# Patient Record
Sex: Male | Born: 1990 | Race: Black or African American | Hispanic: No | Marital: Single | Smoking: Current every day smoker
Health system: Southern US, Community
[De-identification: ages and names within clinical notes are randomized; demographics above are authoritative.]

## PROBLEM LIST (undated history)

## (undated) DIAGNOSIS — L0291 Cutaneous abscess, unspecified: Secondary | ICD-10-CM

---
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
## 2011-03-05 ENCOUNTER — Emergency Department (INDEPENDENT_AMBULATORY_CARE_PROVIDER_SITE_OTHER)
Admission: EM | Admit: 2011-03-05 | Discharge: 2011-03-05 | Disposition: A | Discharge status: Home / Self Care | Payer: Self-pay | Source: Home / Self Care | Attending: Emergency Medicine | Admitting: Emergency Medicine

## 2011-03-05 ENCOUNTER — Encounter: Payer: Self-pay | Admitting: *Deleted

## 2011-03-05 DIAGNOSIS — L0291 Cutaneous abscess, unspecified: Secondary | ICD-10-CM

## 2011-03-05 DIAGNOSIS — L0390 Cellulitis, unspecified: Secondary | ICD-10-CM

## 2011-03-05 MED ORDER — DOXYCYCLINE HYCLATE 100 MG PO CAPS: 100.0000 mg | ORAL_CAPSULE | Freq: Two times a day (BID) | ORAL | Status: AC

## 2011-03-05 NOTE — ED Provider Notes (Signed)
History     CSN: [id number] Arrival date & time: 03/05/2011  4:26 PM   First MD Initiated Contact with Patient 03/05/11 1617      Chief Complaint  Patient presents with  . Abscess    (Consider location/radiation/quality/duration/timing/severity/associated sxs/prior treatment) Patient is a 20 y.o. male presenting with abscess. The history is provided by the patient.  Abscess  This is a new problem. The problem occurs frequently. The problem has been unchanged. The problem is moderate. The abscess is characterized by redness, painfulness and swelling. The patient was exposed to OTC medications. Pertinent negatives include no anorexia and no fever.    History reviewed. No pertinent past medical history.  History reviewed. No pertinent past surgical history.  History reviewed. No pertinent family history.  History  Substance Use Topics  . Smoking status: Current Everyday Smoker  . Smokeless tobacco: Not on file  . Alcohol Use: No      Review of Systems  Constitutional: Negative for fever.  Gastrointestinal: Negative for anorexia.    Allergies  Review of patient's allergies indicates no known allergies.  Home Medications   Current Outpatient Rx  Name Route Sig Dispense Refill  . DOXYCYCLINE HYCLATE 100 MG PO CAPS Oral Take 1 capsule (100 mg total) by mouth 2 (two) times daily. X 10 days 20 capsule 0    BP 133/59  Pulse 65  Temp(Src) 98.3 F (36.8 C) (Oral)  Resp 16  SpO2 100%  Physical Exam  Nursing note and vitals reviewed. Musculoskeletal: He exhibits tenderness. He exhibits no edema.       Right knee: He exhibits erythema.       Legs: Skin: There is erythema.    ED Course  INCISION AND DRAINAGE Performed by: Vahan Wadsworth Authorized by: Jimmie Molly Consent: Verbal consent obtained. Patient understanding: patient states understanding of the procedure being performed Type: abscess Body area: lower extremity Local anesthetic: topical  anesthetic Scalpel size: 11 Drainage: purulent Packing material: 1/2 in gauze   (including critical care time)  Labs Reviewed - No data to display No results found.   1. Abscess and cellulitis       MDM  Localized abscess with cellulitis inner aspect Left thigh        Jimmie Molly, MD 03/05/11 1725

## 2011-03-05 NOTE — ED Notes (Signed)
Pt with ? Abscess right upper inner thigh onset x 3 days this am draining - painful to palpation

## 2013-09-14 ENCOUNTER — Encounter (HOSPITAL_COMMUNITY): Payer: Self-pay | Admitting: Emergency Medicine

## 2013-09-14 ENCOUNTER — Emergency Department (HOSPITAL_COMMUNITY)
Admission: EM | Admit: 2013-09-14 | Discharge: 2013-09-14 | Disposition: A | Discharge status: Home / Self Care | Payer: Medicaid Other | Attending: Emergency Medicine | Admitting: Emergency Medicine

## 2013-09-14 ENCOUNTER — Emergency Department (HOSPITAL_COMMUNITY): Payer: Medicaid Other

## 2013-09-14 DIAGNOSIS — S62309A Unspecified fracture of unspecified metacarpal bone, initial encounter for closed fracture: Secondary | ICD-10-CM | POA: Insufficient documentation

## 2013-09-14 DIAGNOSIS — F17200 Nicotine dependence, unspecified, uncomplicated: Secondary | ICD-10-CM | POA: Insufficient documentation

## 2013-09-14 DIAGNOSIS — S62300A Unspecified fracture of second metacarpal bone, right hand, initial encounter for closed fracture: Secondary | ICD-10-CM

## 2013-09-14 MED ORDER — ACETAMINOPHEN 325 MG PO TABS
650.0000 mg | ORAL_TABLET | Freq: Once | ORAL | Status: AC
  Administered 2013-09-14: 650 mg via ORAL
  Filled 2013-09-14: qty 2
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
## 2013-09-14 NOTE — Discharge Instructions (Signed)
Follow up with Dr. Melvyn Novasrtmann for further evaluation of your fracture. Call to schedule your appointment. Refer to attached documents for more information.

## 2013-09-14 NOTE — ED Provider Notes (Signed)
Medical screening examination/treatment/procedure(s) were performed by non-physician practitioner and as supervising physician I was immediately available for consultation/collaboration.   EKG Interpretation None      Iva Knapp, MD, FACEP   Iva L Knapp, MD 09/14/13 2210 

## 2014-06-22 ENCOUNTER — Emergency Department (INDEPENDENT_AMBULATORY_CARE_PROVIDER_SITE_OTHER)
Admission: EM | Admit: 2014-06-22 | Discharge: 2014-06-22 | Disposition: A | Discharge status: Home / Self Care | Payer: Self-pay | Source: Home / Self Care | Attending: Family Medicine | Admitting: Family Medicine

## 2014-06-22 ENCOUNTER — Encounter (HOSPITAL_COMMUNITY): Payer: Self-pay | Admitting: *Deleted

## 2014-06-22 DIAGNOSIS — J029 Acute pharyngitis, unspecified: Secondary | ICD-10-CM

## 2014-06-22 LAB — POCT RAPID STREP A: Streptococcus, Group A Screen (Direct): NEGATIVE

## 2014-06-22 MED ORDER — ACETAMINOPHEN 325 MG PO TABS
ORAL_TABLET | ORAL | Status: AC
  Filled 2014-06-22: qty 2

## 2014-06-22 MED ORDER — ACETAMINOPHEN 325 MG PO TABS
650.0000 mg | ORAL_TABLET | Freq: Once | ORAL | Status: AC
  Administered 2014-06-22: 650 mg via ORAL

## 2014-06-22 NOTE — ED Provider Notes (Signed)
CSN: [id number]     Arrival date & time 06/22/14  1541 History   First MD Initiated Contact with Patient 06/22/14 1706     Chief Complaint  Patient presents with  . Sore Throat   (Consider location/radiation/quality/duration/timing/severity/associated sxs/prior Treatment) HPI Comments: Smoker [place] [employer] Fever today  Patient is a 24 y.o. male presenting with pharyngitis. The history is provided by the patient.  Sore Throat This is a new problem. Episode onset: sx x 2-3 days. The problem occurs constantly. The problem has not changed since onset.Associated symptoms comments: Bilateral ear pain Congestion  Occasional headache.    History reviewed. No pertinent past medical history. History reviewed. No pertinent past surgical history. History reviewed. No pertinent family history. History  Substance Use Topics  . Smoking status: Current Every Day Smoker  . Smokeless tobacco: Not on file  . Alcohol Use: No    Review of Systems  All other systems reviewed and are negative.   Allergies  Review of patient's allergies indicates no known allergies.  Home Medications   Prior to Admission medications   Not on File   BP 124/67 mmHg  Pulse 74  Temp(Src) 99.9 F (37.7 C) (Oral)  Resp 16  SpO2 100% Physical Exam  Constitutional: He is oriented to person, place, and time. He appears well-developed and well-nourished. No distress.  HENT:  Head: Normocephalic and atraumatic.  Right Ear: Hearing, tympanic membrane, external ear and ear canal normal.  Left Ear: Hearing, tympanic membrane, external ear and ear canal normal.  Nose: Nose normal.  Mouth/Throat: Uvula is midline, oropharynx is clear and moist and mucous membranes are normal.  Eyes: Conjunctivae are normal.  Neck: Normal range of motion.  Cardiovascular: Normal rate, regular rhythm and normal heart sounds.   Pulmonary/Chest: Effort normal and breath sounds normal.  Abdominal: Soft. Bowel sounds are  normal.  Musculoskeletal: Normal range of motion.  Lymphadenopathy:    He has no cervical adenopathy.  Neurological: He is alert and oriented to person, place, and time.  Skin: Skin is warm and dry. No rash noted. No erythema.  Psychiatric: He has a normal mood and affect. His behavior is normal.  Nursing note and vitals reviewed.   ED Course  Procedures (including critical care time) Labs Review Labs Reviewed  POCT RAPID STREP A ([facility]RG CARE ONLY)    Imaging Review No results found.   MDM   1. Pharyngitis   Your test for strep throat was negative. Please use either tylenol or ibuprofen at home as directed on packaging for pain and fever. Warm salt water gargles 3-4 x day. Your throat swab will be held for three day culture and if results indicate the need for treatment, you will be notified by phone. Expect improvement over the next few days. Follow up if symptoms become worse.     Ria ClockJennifer Lee H Nayson Traweek, [place] 06/22/14 [phone]

## 2014-06-22 NOTE — Discharge Instructions (Signed)
Your test for strep throat was negative. Please use either tylenol or ibuprofen at home as directed on packaging for pain and fever. Warm salt water gargles 3-4 x day. Your throat swab will be held for three day culture and if results indicate the need for treatment, you will be notified by phone. Expect improvement over the next few days. Follow up if symptoms become worse.   Pharyngitis Pharyngitis is redness, pain, and swelling (inflammation) of your pharynx.  CAUSES  Pharyngitis is usually caused by infection. Most of the time, these infections are from viruses (viral) and are part of a cold. However, sometimes pharyngitis is caused by bacteria (bacterial). Pharyngitis can also be caused by allergies. Viral pharyngitis may be spread from person to person by coughing, sneezing, and personal items or utensils (cups, forks, spoons, toothbrushes). Bacterial pharyngitis may be spread from person to person by more intimate contact, such as kissing.  SIGNS AND SYMPTOMS  Symptoms of pharyngitis include:   Sore throat.   Tiredness (fatigue).   Low-grade fever.   Headache.  Joint pain and muscle aches.  Skin rashes.  Swollen lymph nodes.  Plaque-like film on throat or tonsils (often seen with bacterial pharyngitis). DIAGNOSIS  Your health care provider will ask you questions about your illness and your symptoms. Your medical history, along with a physical exam, is often all that is needed to diagnose pharyngitis. Sometimes, a rapid strep test is done. Other lab tests may also be done, depending on the suspected cause.  TREATMENT  Viral pharyngitis will usually get better in 3-4 days without the use of medicine. Bacterial pharyngitis is treated with medicines that kill germs (antibiotics).  HOME CARE INSTRUCTIONS   Drink enough water and fluids to keep your urine clear or pale yellow.   Only take over-the-counter or prescription medicines as directed by your health care provider:    If you are prescribed antibiotics, make sure you finish them even if you start to feel better.   Do not take aspirin.   Get lots of rest.   Gargle with 8 oz of salt water ( tsp of salt per 1 qt of water) as often as every 1-2 hours to soothe your throat.   Throat lozenges (if you are not at risk for choking) or sprays may be used to soothe your throat. SEEK MEDICAL CARE IF:   You have large, tender lumps in your neck.  You have a rash.  You cough up green, yellow-brown, or bloody spit. SEEK IMMEDIATE MEDICAL CARE IF:   Your neck becomes stiff.  You drool or are unable to swallow liquids.  You vomit or are unable to keep medicines or liquids down.  You have severe pain that does not go away with the use of recommended medicines.  You have trouble breathing (not caused by a stuffy nose). MAKE SURE YOU:   Understand these instructions.  Will watch your condition.  Will get help right away if you are not doing well or get worse. Document Released: 03/17/2005 Document Revised: 01/05/2013 Document Reviewed: 11/22/2012 [facility] Patient Information 2015 [place], [place]. This information is not intended to replace advice given to you by your health care provider. Make sure you discuss any questions you have with your health care provider.  Salt Water Gargle This solution will help make your mouth and throat feel better. HOME CARE INSTRUCTIONS   Mix 1 teaspoon of salt in 8 ounces of warm water.  Gargle with this solution as much or often as  you need or as directed. Swish and gargle gently if you have any sores or wounds in your mouth.  Do not swallow this mixture. Document Released: 12/20/2003 Document Revised: 06/09/2011 Document Reviewed: 05/12/2008 [facility] Patient Information 2015 [place], [place]. This information is not intended to replace advice given to you by your health care provider. Make sure you discuss any questions you have with your health care  provider.  Sore Throat A sore throat is pain, burning, irritation, or scratchiness of the throat. There is often pain or tenderness when swallowing or talking. A sore throat may be accompanied by other symptoms, such as coughing, sneezing, fever, and swollen neck glands. A sore throat is often the first sign of another sickness, such as a cold, flu, strep throat, or mononucleosis (commonly known as mono). Most sore throats go away without medical treatment. CAUSES  The most common causes of a sore throat include:  A viral infection, such as a cold, flu, or mono.  A bacterial infection, such as strep throat, tonsillitis, or whooping cough.  Seasonal allergies.  Dryness in the air.  Irritants, such as smoke or pollution.  Gastroesophageal reflux disease (GERD). HOME CARE INSTRUCTIONS   Only take over-the-counter medicines as directed by your caregiver.  Drink enough fluids to keep your urine clear or pale yellow.  Rest as needed.  Try using throat sprays, lozenges, or sucking on hard candy to ease any pain (if older than 4 years or as directed).  Sip warm liquids, such as broth, herbal tea, or warm water with honey to relieve pain temporarily. You may also eat or drink cold or frozen liquids such as frozen ice pops.  Gargle with salt water (mix 1 tsp salt with 8 oz of water).  Do not smoke and avoid secondhand smoke.  Put a cool-mist humidifier in your bedroom at night to moisten the air. You can also turn on a hot shower and sit in the bathroom with the door closed for 5-10 minutes. SEEK IMMEDIATE MEDICAL CARE IF:  You have difficulty breathing.  You are unable to swallow fluids, soft foods, or your saliva.  You have increased swelling in the throat.  Your sore throat does not get better in 7 days.  You have nausea and vomiting.  You have a fever or persistent symptoms for more than 2-3 days.  You have a fever and your symptoms suddenly get worse. MAKE SURE YOU:    Understand these instructions.  Will watch your condition.  Will get help right away if you are not doing well or get worse. Document Released: 04/24/2004 Document Revised: 03/03/2012 Document Reviewed: 11/23/2011 [facility] Patient Information 2015 [place], [place]. This information is not intended to replace advice given to you by your health care provider. Make sure you discuss any questions you have with your health care provider.  Strep Throat Tests While most sore throats are caused by viruses, at times they are caused by a bacteria called group A Streptococci (strep throat). It is important to determine the cause because the strep bacteria is treated with antibiotic medication. There are 2 types of tests for strep throat: a rapid strep test and a throat culture. Both tests are done by wiping a swab over the back of the throat and then using chemicals to identify the type of bacteria present. The rapid strep test takes 10 to 20 minutes. If the rapid strep test is negative, a throat culture may be performed to confirm the results. With a throat culture, the  swab is used to spread the bacteria on a gel plate and grow it in a lab, which may take 1 to 2 days. In some cases, the culture will detect strep bacteria not found with the rapid strep test. If the result of the rapid strep test is positive, no further testing is needed, and your caregiver will prescribe antibiotics. Not all test results are available during your visit. If your test results are not back during the visit, make an appointment with your caregiver to find out the results. Do not assume everything is normal if you have not heard from your caregiver or the medical facility. It is important for you to follow up on all of your test results. SEEK MEDICAL CARE IF:   Your symptoms are not improving within 1 to 2 days, or you are getting worse.  You have any other questions or concerns. SEEK IMMEDIATE MEDICAL CARE IF:   You have  increased difficulty with swallowing.  You develop trouble breathing.  You have a fever. Document Released: 04/24/2004 Document Revised: 06/09/2011 Document Reviewed: 06/22/2013 [facility] Patient Information 2015 [place], [place]. This information is not intended to replace advice given to you by your health care provider. Make sure you discuss any questions you have with your health care provider.

## 2014-06-22 NOTE — ED Notes (Signed)
Pt        Reports  Symptoms     Of  sorethroat  Fever       And  Bilateral  Earache  With  The  Onset      For  About   6  Days     Pt reppots  Symptoms  Not  releived  By OTC meds   And  Or  Home  Remedies        Pt is  Sitting  Upright  On the  Exam table  Speaking in complete  sentances  And  Appearing  In  No  Acute  Distress

## 2014-06-26 LAB — CULTURE, GROUP A STREP

## 2014-06-26 NOTE — ED Notes (Signed)
Throat culture: Strep beta hemolytic not group A.  Lab shown to Dr. Piedad ClimesHonig. She reviewed chart and said no further treatment needed. Vassie MoselleYork, Artelia Game M 06/26/2014

## 2014-08-09 ENCOUNTER — Emergency Department (HOSPITAL_COMMUNITY)
Admission: EM | Admit: 2014-08-09 | Discharge: 2014-08-09 | Disposition: A | Discharge status: Home / Self Care | Payer: Self-pay | Attending: Emergency Medicine | Admitting: Emergency Medicine

## 2014-08-09 ENCOUNTER — Emergency Department (HOSPITAL_COMMUNITY): Payer: Self-pay

## 2014-08-09 ENCOUNTER — Encounter (HOSPITAL_COMMUNITY): Payer: Self-pay | Admitting: Family Medicine

## 2014-08-09 DIAGNOSIS — S80812A Abrasion, left lower leg, initial encounter: Secondary | ICD-10-CM | POA: Insufficient documentation

## 2014-08-09 DIAGNOSIS — S79912A Unspecified injury of left hip, initial encounter: Secondary | ICD-10-CM | POA: Insufficient documentation

## 2014-08-09 DIAGNOSIS — Y998 Other external cause status: Secondary | ICD-10-CM | POA: Insufficient documentation

## 2014-08-09 DIAGNOSIS — Z720 Tobacco use: Secondary | ICD-10-CM | POA: Insufficient documentation

## 2014-08-09 DIAGNOSIS — M25562 Pain in left knee: Secondary | ICD-10-CM

## 2014-08-09 DIAGNOSIS — Y92410 Unspecified street and highway as the place of occurrence of the external cause: Secondary | ICD-10-CM | POA: Insufficient documentation

## 2014-08-09 DIAGNOSIS — Y9355 Activity, bike riding: Secondary | ICD-10-CM | POA: Insufficient documentation

## 2014-08-09 DIAGNOSIS — M25559 Pain in unspecified hip: Secondary | ICD-10-CM

## 2014-08-09 MED ORDER — NAPROXEN 250 MG PO TABS: 250.0000 mg | ORAL_TABLET | Freq: Two times a day (BID) | ORAL | Status: DC
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
## 2014-08-09 NOTE — ED Notes (Signed)
Pt returned from xray

## 2014-08-09 NOTE — ED Notes (Signed)
Patient transported to X-ray 
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
## 2015-01-06 ENCOUNTER — Emergency Department (HOSPITAL_COMMUNITY): Payer: Self-pay

## 2015-01-06 ENCOUNTER — Emergency Department (HOSPITAL_COMMUNITY)
Admission: EM | Admit: 2015-01-06 | Discharge: 2015-01-06 | Disposition: A | Discharge status: Home / Self Care | Payer: Self-pay | Attending: Emergency Medicine | Admitting: Emergency Medicine

## 2015-01-06 ENCOUNTER — Encounter (HOSPITAL_COMMUNITY): Payer: Self-pay | Admitting: Nurse Practitioner

## 2015-01-06 DIAGNOSIS — R1011 Right upper quadrant pain: Secondary | ICD-10-CM | POA: Insufficient documentation

## 2015-01-06 DIAGNOSIS — Z720 Tobacco use: Secondary | ICD-10-CM | POA: Insufficient documentation

## 2015-01-06 DIAGNOSIS — R1013 Epigastric pain: Secondary | ICD-10-CM | POA: Insufficient documentation

## 2015-01-06 DIAGNOSIS — R197 Diarrhea, unspecified: Secondary | ICD-10-CM | POA: Insufficient documentation

## 2015-01-06 LAB — URINALYSIS, ROUTINE W REFLEX MICROSCOPIC
GLUCOSE, UA: NEGATIVE mg/dL
KETONES UR: NEGATIVE mg/dL
Leukocytes, UA: NEGATIVE
Nitrite: NEGATIVE
PH: 5.5 (ref 5.0–8.0)
Protein, ur: NEGATIVE mg/dL
Specific Gravity, Urine: 1.031 — ABNORMAL HIGH (ref 1.005–1.030)
Urobilinogen, UA: 0.2 mg/dL (ref 0.0–1.0)

## 2015-01-06 LAB — COMPREHENSIVE METABOLIC PANEL
ALK PHOS: 86 U/L (ref 38–126)
ALT: 24 U/L (ref 17–63)
AST: 31 U/L (ref 15–41)
Albumin: 4.5 g/dL (ref 3.5–5.0)
Anion gap: 6 (ref 5–15)
BUN: 11 mg/dL (ref 6–20)
CALCIUM: 9.4 mg/dL (ref 8.9–10.3)
CHLORIDE: 106 mmol/L (ref 101–111)
CO2: 24 mmol/L (ref 22–32)
CREATININE: 1.01 mg/dL (ref 0.61–1.24)
GFR calc Af Amer: >60 mL/min (ref >60)
Glucose, Bld: 87 mg/dL (ref 65–99)
Potassium: 4 mmol/L (ref 3.5–5.1)
Sodium: 136 mmol/L (ref 135–145)
Total Bilirubin: 0.5 mg/dL (ref 0.3–1.2)
Total Protein: 7.8 g/dL (ref 6.5–8.1)

## 2015-01-06 LAB — URINE MICROSCOPIC-ADD ON

## 2015-01-06 LAB — CBC
HCT: 46.9 % (ref 39.0–52.0)
Hemoglobin: 16.1 g/dL (ref 13.0–17.0)
MCH: 31 pg (ref 26.0–34.0)
MCHC: 34.3 g/dL (ref 30.0–36.0)
MCV: 90.2 fL (ref 78.0–100.0)
PLATELETS: 408 10*3/uL — AB (ref 150–400)
RBC: 5.2 MIL/uL (ref 4.22–5.81)
RDW: 12.5 % (ref 11.5–15.5)
WBC: 11.1 10*3/uL — AB (ref 4.0–10.5)

## 2015-01-06 LAB — LIPASE, BLOOD: LIPASE: 28 U/L (ref 22–51)

## 2015-01-06 MED ORDER — METRONIDAZOLE 500 MG PO TABS: 500.0000 mg | ORAL_TABLET | Freq: Two times a day (BID) | ORAL | Status: DC

## 2015-01-06 MED ORDER — OMEPRAZOLE 20 MG PO CPDR: 20.0000 mg | DELAYED_RELEASE_CAPSULE | Freq: Every day | ORAL | Status: DC

## 2015-01-06 MED ORDER — SODIUM CHLORIDE 0.9 % IV BOLUS (SEPSIS)
1000.0000 mL | Freq: Once | INTRAVENOUS | Status: AC
  Administered 2015-01-06: 1000 mL via INTRAVENOUS

## 2015-01-06 MED ORDER — CIPROFLOXACIN HCL 500 MG PO TABS: 500.0000 mg | ORAL_TABLET | Freq: Two times a day (BID) | ORAL | Status: DC

## 2015-01-06 MED ORDER — GI COCKTAIL ~~LOC~~
30.0000 mL | Freq: Once | ORAL | Status: AC
  Administered 2015-01-06: 30 mL via ORAL
  Filled 2015-01-06: qty 30

## 2015-01-06 MED ORDER — ONDANSETRON HCL 4 MG PO TABS: 4.0000 mg | ORAL_TABLET | Freq: Three times a day (TID) | ORAL | Status: DC | PRN

## 2015-01-06 NOTE — ED Notes (Signed)
He c/o 5-6 day history of generalized abd pain, belching with foul smell, having to have bowel movement every time he eats or drinks anything.he denies n/v/fevers/urinary changes. He is A&Ox4, resp e/u

## 2015-01-06 NOTE — ED Notes (Signed)
Pt is in stable condition upon d/c and ambulates from ED. 

## 2015-01-06 NOTE — ED Provider Notes (Signed)
CSN: [id number]     Arrival date & time 01/06/15  1222 History   First MD Initiated Contact with Patient 01/06/15 1317     Chief Complaint  Patient presents with  . Abdominal Pain     (Consider location/radiation/quality/duration/timing/severity/associated sxs/prior Treatment) Patient is a 24 y.o. male presenting with abdominal pain. The history is provided by the patient.  Abdominal Pain Pain location:  Epigastric and RUQ Pain quality: sharp and squeezing   Pain radiates to:  Does not radiate Pain severity:  Severe Duration:  5 days Timing:  Intermittent Progression:  Waxing and waning Chronicity:  New Context: alcohol use   Context: not recent illness, not sick contacts and not suspicious food intake   Relieved by:  Nothing Ineffective treatments:  Acetaminophen and belching Associated symptoms: belching and diarrhea   Associated symptoms: no anorexia, no chest pain, no chills, no constipation, no cough, no dysuria, no fatigue, no fever, no flatus, no hematemesis, no hematochezia, no hematuria, no nausea, no shortness of breath, no sore throat and no vomiting   Risk factors: no aspirin use, not obese and no recent hospitalization   Risk factors comment:  Goody powder last week  Pt is a 24 y.o. Male, otherwise healthy, presents to the emergency room complaining of epigastric and RUQ abdominal pain with dyspepsia, bloating, foul belching, and 6 days of green diarrhea, without blood.  Abdominal pain is rated 3/10, without radiation, intermittent without exacerbating or alleviating factors.  He denies daily ETOH or NSAID use.  He denies any recent antibiotic use.  He has not other complaints.  History reviewed. No pertinent past medical history. History reviewed. No pertinent past surgical history. History reviewed. No pertinent family history. Social History  Substance Use Topics  . Smoking status: Current Every Day Smoker    Types: Cigarettes  . Smokeless tobacco: None  .  Alcohol Use: No    Review of Systems  Constitutional: Negative for fever, chills and fatigue.  HENT: Negative for sore throat.   Respiratory: Negative for cough and shortness of breath.   Cardiovascular: Negative for chest pain.  Gastrointestinal: Positive for abdominal pain and diarrhea. Negative for nausea, vomiting, constipation, hematochezia, anorexia, flatus and hematemesis.  Genitourinary: Negative for dysuria and hematuria.      Allergies  Review of patient's allergies indicates no known allergies.  Home Medications   Prior to Admission medications   Medication Sig Start Date End Date Taking? Authorizing Provider  acetaminophen (TYLENOL) 500 MG tablet Take 500 mg by mouth every 6 (six) hours as needed for mild pain.   Yes Historical Provider, MD  ciprofloxacin (CIPRO) 500 MG tablet Take 1 tablet (500 mg total) by mouth 2 (two) times daily. 01/06/15   Danelle Berry, PA-C  metroNIDAZOLE (FLAGYL) 500 MG tablet Take 1 tablet (500 mg total) by mouth 2 (two) times daily. 01/06/15   Danelle Berry, PA-C  omeprazole (PRILOSEC) 20 MG capsule Take 1 capsule (20 mg total) by mouth daily. 01/06/15   Danelle Berry, PA-C  ondansetron (ZOFRAN) 4 MG tablet Take 1 tablet (4 mg total) by mouth every 8 (eight) hours as needed for nausea or vomiting. 01/06/15   Danelle Berry, PA-C   BP 114/76 mmHg  Pulse 60  Temp(Src) 98.7 F (37.1 C) (Oral)  Resp 16  Ht 6\' 1"  (1.854 m)  Wt 202 lb 11.2 oz (91.944 kg)  BMI 26.75 kg/m2  SpO2 99% Physical Exam  Constitutional: He is oriented to person, place, and time. He appears well-developed and  well-nourished. No distress.  HENT:  Head: Normocephalic and atraumatic.  Nose: Nose normal.  Mouth/Throat: Oropharynx is clear and moist. No oropharyngeal exudate.  Eyes: Conjunctivae and EOM are normal. Pupils are equal, round, and reactive to light. Right eye exhibits no discharge. Left eye exhibits no discharge. No scleral icterus.  Neck: Normal range of motion. No JVD  present. No tracheal deviation present. No thyromegaly present.  Cardiovascular: Normal rate, regular rhythm, normal heart sounds and intact distal pulses.  Exam reveals no gallop and no friction rub.   No murmur heard. Pulmonary/Chest: Effort normal and breath sounds normal. No respiratory distress. He has no wheezes. He has no rales. He exhibits no tenderness.  Abdominal: Soft. Bowel sounds are normal. He exhibits no distension and no mass. There is tenderness. There is no rebound and no guarding.  Mild epigastric and RUQ tenderness  Musculoskeletal: Normal range of motion. He exhibits no edema or tenderness.  Lymphadenopathy:    He has no cervical adenopathy.  Neurological: He is alert and oriented to person, place, and time. He has normal reflexes. No cranial nerve deficit. He exhibits normal muscle tone. Coordination normal.  Skin: Skin is warm and dry. No rash noted. He is not diaphoretic. No erythema. No pallor.  Psychiatric: He has a normal mood and affect. His behavior is normal. Judgment and thought content normal.  Nursing note and vitals reviewed.   ED Course  Procedures (including critical care time) Labs Review Labs Reviewed  CBC - Abnormal; Notable for the following:    WBC 11.1 (*)    Platelets 408 (*)    All other components within normal limits  URINALYSIS, ROUTINE W REFLEX MICROSCOPIC (NOT AT [facility]) - Abnormal; Notable for the following:    Specific Gravity, Urine 1.031 (*)    Hgb urine dipstick SMALL (*)    Bilirubin Urine SMALL (*)    All other components within normal limits  URINE MICROSCOPIC-ADD ON - Abnormal; Notable for the following:    Crystals CA OXALATE CRYSTALS (*)    All other components within normal limits  COMPREHENSIVE METABOLIC PANEL  LIPASE, BLOOD    Imaging Review US Abdomen Limited Ruq  01/06/2015   CLINICAL DATA:  Right upper quadrant and epigastric pain for 5 days.  EXAM: US ABDOMEN LIMITED - RIGHT UPPER QUADRANT  COMPARISON:  CT abdomen  pelvis 05/22/2010  FINDINGS: Gallbladder:  No gallstones or wall thickening visualized. No sonographic Murphy sign noted.  Common bile duct:  Diameter: 2 mm  Liver:  No focal lesion identified. Within normal limits in parenchymal echogenicity.  IMPRESSION: No cholelithiasis or sonographic evidence for acute cholecystitis.   Electronically Signed   By: Annia Belt M.D.   On: 01/06/2015 15:46   I have personally reviewed and evaluated these images and lab results as part of my medical decision-making.   EKG Interpretation None      MDM   Final diagnoses:  Right upper quadrant pain  Diarrhea, unspecified type    Pt with diarrhea and abdominal pain, both which occur after eating. He has associated dyspepsia and otherwise denies any other symptoms. His vitals are within normal limits, exam reveals mild epigastric and right upper quadrant tenderness. We will obtain basic labs, right upper quadrant ultrasound.  Patient reported significant improvement with GI cocktail.  Patient's lab work with significant for mild leukocytosis of 11.1. Lipase is negative.  Right upper quadrant ultrasound negative for cholelithiasis or acute cholecystitis. The patient has had no vomiting, no diarrhea or  blood in the ER.  Will treat for infectious gastroenteritis, with Cipro, Flagyl, Zofran for nausea, and PPI for GERD/dyspepsia  The patient was updated with findings, given strict return precautions, he verbalized understanding. He was given follow-up with [facility] and [facility] as needed.  Filed Vitals:   01/06/15 1430 01/06/15 1445 01/06/15 1500 01/06/15 1515  BP: 111/76 110/80 119/70 114/76  Pulse: 69 68 74 60  Temp:      TempSrc:      Resp:      Height:      Weight:      SpO2: 97% 98% 100% 99%   Medications  gi cocktail (Maalox,Lidocaine,Donnatal) (30 mLs Oral Given 01/06/15 1356)  sodium chloride 0.9 % bolus 1,000 mL (1,000 mLs Intravenous New Bag/Given 01/06/15 1624)     Danelle Berry, PA-C 01/08/15 1039  Nelva Nay, MD 01/12/15 1220

## 2015-01-06 NOTE — ED Notes (Signed)
Pt returned from US

## 2015-01-06 NOTE — Discharge Instructions (Signed)
Abdominal Pain, Adult °Many things can cause abdominal pain. Usually, abdominal pain is not caused by a disease and will improve without treatment. It can often be observed and treated at home. Your health care provider will do a physical exam and possibly order blood tests and X-rays to help determine the seriousness of your pain. However, in many cases, more time must pass before a clear cause of the pain can be found. Before that point, your health care provider may not know if you need more testing or further treatment. °HOME CARE INSTRUCTIONS °Monitor your abdominal pain for any changes. The following actions may help to alleviate any discomfort you are experiencing: °· Only take over-the-counter or prescription medicines as directed by your health care provider. °· Do not take laxatives unless directed to do so by your health care provider. °· Try a clear liquid diet (broth, tea, or water) as directed by your health care provider. Slowly move to a bland diet as tolerated. °SEEK MEDICAL CARE IF: °· You have unexplained abdominal pain. °· You have abdominal pain associated with nausea or diarrhea. °· You have pain when you urinate or have a bowel movement. °· You experience abdominal pain that wakes you in the night. °· You have abdominal pain that is worsened or improved by eating food. °· You have abdominal pain that is worsened with eating fatty foods. °· You have a fever. °SEEK IMMEDIATE MEDICAL CARE IF: °· Your pain does not go away within 2 hours. °· You keep throwing up (vomiting). °· Your pain is felt only in portions of the abdomen, such as the right side or the left lower portion of the abdomen. °· You pass bloody or black tarry stools. °MAKE SURE YOU: °· Understand these instructions. °· Will watch your condition. °· Will get help right away if you are not doing well or get worse. °  °This information is not intended to replace advice given to you by your health care provider. Make sure you discuss  any questions you have with your health care provider. °  °Document Released: 12/25/2004 Document Revised: 12/06/2014 Document Reviewed: 11/24/2012 °Elsevier Interactive Patient Education ©2016 Elsevier Inc[employer] ° °Diarrhea °Diarrhea is frequent loose and watery bowel movements. It can cause you to feel weak and dehydrated. Dehydration can cause you to become tired and thirsty, have a dry mouth, and have decreased urination that often is dark yellow. Diarrhea is a sign of another problem, most often an infection that will not last long. In most cases, diarrhea typically lasts 2-3 days. However, it can last longer if it is a sign of something more serious. It is important to treat your diarrhea as directed by your caregiver to lessen or prevent future episodes of diarrhea. °CAUSES  °Some common causes include: °· Gastrointestinal infections caused by viruses, bacteria, or parasites. °· Food poisoning or food allergies. °· Certain medicines, such as antibiotics, chemotherapy, and laxatives. °· Artificial sweeteners and fructose. °· Digestive disorders. °HOME CARE INSTRUCTIONS °· Ensure adequate fluid intake (hydration): Have 1 cup (8 oz) of fluid for each diarrhea episode. Avoid fluids that contain simple sugars or sports drinks, fruit juices, whole milk products, and sodas. Your urine should be clear or pale yellow if you are drinking enough fluids. Hydrate with an oral rehydration solution that you can purchase at pharmacies, retail stores, and online. You can prepare an oral rehydration solution at home by mixing the following ingredients together: °¨  - tsp table salt. °¨ ¾ tsp baking soda. °¨    tsp salt substitute containing potassium chloride. °¨ 1  tablespoons sugar. °¨ 1 L (34 oz) of water. °· Certain foods and beverages may increase the speed at which food moves through the gastrointestinal (GI) tract. These foods and beverages should be avoided and include: °¨ Caffeinated and alcoholic beverages. °¨ High-fiber  foods, such as raw fruits and vegetables, nuts, seeds, and whole grain breads and cereals. °¨ Foods and beverages sweetened with sugar alcohols, such as xylitol, sorbitol, and mannitol. °· Some foods may be well tolerated and may help thicken stool including: °¨ Starchy foods, such as rice, toast, pasta, low-sugar cereal, oatmeal, grits, baked potatoes, crackers, and bagels. °¨ Bananas. °¨ Applesauce. °· Add probiotic-rich foods to help increase healthy bacteria in the GI tract, such as yogurt and fermented milk products. °· Wash your hands well after each diarrhea episode. °· Only take over-the-counter or prescription medicines as directed by your caregiver. °· Take a warm bath to relieve any burning or pain from frequent diarrhea episodes. °SEEK IMMEDIATE MEDICAL CARE IF:  °· You are unable to keep fluids down. °· You have persistent vomiting. °· You have blood in your stool, or your stools are black and tarry. °· You do not urinate in 6-8 hours, or there is only a small amount of very dark urine. °· You have abdominal pain that increases or localizes. °· You have weakness, dizziness, confusion, or light-headedness. °· You have a severe headache. °· Your diarrhea gets worse or does not get better. °· You have a fever or persistent symptoms for more than 2-3 days. °· You have a fever and your symptoms suddenly get worse. °MAKE SURE YOU:  °· Understand these instructions. °· Will watch your condition. °· Will get help right away if you are not doing well or get worse. °  °This information is not intended to replace advice given to you by your health care provider. Make sure you discuss any questions you have with your health care provider. °  °Document Released: 03/07/2002 Document Revised: 04/07/2014 Document Reviewed: 11/23/2011 °Elsevier Interactive Patient Education ©2016 Elsevier Inc. ° °

## 2015-04-01 ENCOUNTER — Emergency Department (HOSPITAL_COMMUNITY)
Admission: EM | Admit: 2015-04-01 | Discharge: 2015-04-01 | Disposition: A | Discharge status: Home / Self Care | Payer: Self-pay | Attending: Emergency Medicine | Admitting: Emergency Medicine

## 2015-04-01 ENCOUNTER — Encounter (HOSPITAL_COMMUNITY): Payer: Self-pay | Admitting: *Deleted

## 2015-04-01 DIAGNOSIS — Y9289 Other specified places as the place of occurrence of the external cause: Secondary | ICD-10-CM | POA: Insufficient documentation

## 2015-04-01 DIAGNOSIS — Z792 Long term (current) use of antibiotics: Secondary | ICD-10-CM | POA: Insufficient documentation

## 2015-04-01 DIAGNOSIS — Z79899 Other long term (current) drug therapy: Secondary | ICD-10-CM | POA: Insufficient documentation

## 2015-04-01 DIAGNOSIS — Y998 Other external cause status: Secondary | ICD-10-CM | POA: Insufficient documentation

## 2015-04-01 DIAGNOSIS — F17210 Nicotine dependence, cigarettes, uncomplicated: Secondary | ICD-10-CM | POA: Insufficient documentation

## 2015-04-01 DIAGNOSIS — Y9389 Activity, other specified: Secondary | ICD-10-CM | POA: Insufficient documentation

## 2015-04-01 DIAGNOSIS — Z23 Encounter for immunization: Secondary | ICD-10-CM | POA: Insufficient documentation

## 2015-04-01 DIAGNOSIS — S61411A Laceration without foreign body of right hand, initial encounter: Secondary | ICD-10-CM | POA: Insufficient documentation

## 2015-04-01 DIAGNOSIS — IMO0002 Reserved for concepts with insufficient information to code with codable children: Secondary | ICD-10-CM

## 2015-04-01 DIAGNOSIS — W25XXXA Contact with sharp glass, initial encounter: Secondary | ICD-10-CM | POA: Insufficient documentation

## 2015-04-01 MED ORDER — LIDOCAINE HCL 2 % IJ SOLN
10.0000 mL | Freq: Once | INTRAMUSCULAR | Status: AC
  Administered 2015-04-01: 10 mL
  Filled 2015-04-01: qty 20

## 2015-04-01 MED ORDER — TETANUS-DIPHTH-ACELL PERTUSSIS 5-2.5-18.5 LF-MCG/0.5 IM SUSP
0.5000 mL | Freq: Once | INTRAMUSCULAR | Status: AC
  Administered 2015-04-01: 0.5 mL via INTRAMUSCULAR
  Filled 2015-04-01: qty 0.5

## 2015-04-01 NOTE — ED Notes (Signed)
Pt left with all his belongings and ambulated out of the treatment area.  

## 2015-04-01 NOTE — Discharge Instructions (Signed)
Your stitches need to be removed in 10 days. Go to [facility] urgent care for removal.  Laceration Care, Adult A laceration is a cut that goes through all of the layers of the skin and into the tissue that is right under the skin. Some lacerations heal on their own. Others need to be closed with stitches (sutures), staples, skin adhesive strips, or skin glue. Proper laceration care minimizes the risk of infection and helps the laceration to heal better. HOW TO CARE FOR YOUR LACERATION If sutures or staples were used:  Keep the wound clean and dry.  If you were given a bandage (dressing), you should change it at least one time per day or as told by your health care provider. You should also change it if it becomes wet or dirty.  Keep the wound completely dry for the first 24 hours or as told by your health care provider. After that time, you may shower or bathe. However, make sure that the wound is not soaked in water until after the sutures or staples have been removed.  Clean the wound one time each day or as told by your health care provider:  Wash the wound with soap and water.  Rinse the wound with water to remove all soap.  Pat the wound dry with a clean towel. Do not rub the wound.  After cleaning the wound, apply a thin layer of antibiotic ointmentas told by your health care provider. This will help to prevent infection and keep the dressing from sticking to the wound.  Have the sutures or staples removed as told by your health care provider. If skin adhesive strips were used:  Keep the wound clean and dry.  If you were given a bandage (dressing), you should change it at least one time per day or as told by your health care provider. You should also change it if it becomes dirty or wet.  Do not get the skin adhesive strips wet. You may shower or bathe, but be careful to keep the wound dry.  If the wound gets wet, pat it dry with a clean towel. Do not rub the wound.  Skin  adhesive strips fall off on their own. You may trim the strips as the wound heals. Do not remove skin adhesive strips that are still stuck to the wound. They will fall off in time. If skin glue was used:  Try to keep the wound dry, but you may briefly wet it in the shower or bath. Do not soak the wound in water, such as by swimming.  After you have showered or bathed, gently pat the wound dry with a clean towel. Do not rub the wound.  Do not do any activities that will make you sweat heavily until the skin glue has fallen off on its own.  Do not apply liquid, cream, or ointment medicine to the wound while the skin glue is in place. Using those may loosen the film before the wound has healed.  If you were given a bandage (dressing), you should change it at least one time per day or as told by your health care provider. You should also change it if it becomes dirty or wet.  If a dressing is placed over the wound, be careful not to apply tape directly over the skin glue. Doing that may cause the glue to be pulled off before the wound has healed.  Do not pick at the glue. The skin glue usually remains in  place for 5-10 days, then it falls off of the skin. General Instructions  Take over-the-counter and prescription medicines only as told by your health care provider.  If you were prescribed an antibiotic medicine or ointment, take or apply it as told by your doctor. Do not stop using it even if your condition improves.  To help prevent scarring, make sure to cover your wound with sunscreen whenever you are outside after stitches are removed, after adhesive strips are removed, or when glue remains in place and the wound is healed. Make sure to wear a sunscreen of at least 30 SPF.  Do not scratch or pick at the wound.  Keep all follow-up visits as told by your health care provider. This is important.  Check your wound every day for signs of infection. Watch for:  Redness, swelling, or  pain.  Fluid, blood, or pus.  Raise (elevate) the injured area above the level of your heart while you are sitting or lying down, if possible. SEEK MEDICAL CARE IF:  You received a tetanus shot and you have swelling, severe pain, redness, or bleeding at the injection site.  You have a fever.  A wound that was closed breaks open.  You notice a bad smell coming from your wound or your dressing.  You notice something coming out of the wound, such as wood or glass.  Your pain is not controlled with medicine.  You have increased redness, swelling, or pain at the site of your wound.  You have fluid, blood, or pus coming from your wound.  You notice a change in the color of your skin near your wound.  You need to change the dressing frequently due to fluid, blood, or pus draining from the wound.  You develop a new rash.  You develop numbness around the wound. SEEK IMMEDIATE MEDICAL CARE IF:  You develop severe swelling around the wound.  Your pain suddenly increases and is severe.  You develop painful lumps near the wound or on skin that is anywhere on your body.  You have a red streak going away from your wound.  The wound is on your hand or foot and you cannot properly move a finger or toe.  The wound is on your hand or foot and you notice that your fingers or toes look pale or bluish.   This information is not intended to replace advice given to you by your health care provider. Make sure you discuss any questions you have with your health care provider.   Document Released: 03/17/2005 Document Revised: 08/01/2014 Document Reviewed: 03/13/2014 Elsevier Interactive Patient Education [employer]2016 Elsevier Inc.

## 2015-04-01 NOTE — ED Provider Notes (Signed)
CSN: [id number]     Arrival date & time 04/01/15  0241 History   First MD Initiated Contact with Patient 04/01/15 0413     Chief Complaint  Patient presents with  . Laceration     (Consider location/radiation/quality/duration/timing/severity/associated sxs/prior Treatment) HPI Comments: Patient presents to the emergency room for evaluation of laceration of right hand. Patient reports that he cut his hand on a broken beer bottle. Bleeding is controlled. Patient complains of mild to moderate pain at the site of laceration, no numbness, continuing or weakness of the extremity.  Patient is a 25 y.o. male presenting with skin laceration.  Laceration   History reviewed. No pertinent past medical history. History reviewed. No pertinent past surgical history. No family history on file. Social History  Substance Use Topics  . Smoking status: Current Every Day Smoker    Types: Cigarettes  . Smokeless tobacco: None  . Alcohol Use: No    Review of Systems  Skin: Positive for wound.  All other systems reviewed and are negative.     Allergies  Review of patient's allergies indicates no known allergies.  Home Medications   Prior to Admission medications   Medication Sig Start Date End Date Taking? Authorizing Provider  acetaminophen (TYLENOL) 500 MG tablet Take 500 mg by mouth every 6 (six) hours as needed for mild pain.    Historical Provider, MD  ciprofloxacin (CIPRO) 500 MG tablet Take 1 tablet (500 mg total) by mouth 2 (two) times daily. 01/06/15   Danelle BerryLeisa Tapia, PA-C  metroNIDAZOLE (FLAGYL) 500 MG tablet Take 1 tablet (500 mg total) by mouth 2 (two) times daily. 01/06/15   Danelle BerryLeisa Tapia, PA-C  omeprazole (PRILOSEC) 20 MG capsule Take 1 capsule (20 mg total) by mouth daily. 01/06/15   Danelle BerryLeisa Tapia, PA-C  ondansetron (ZOFRAN) 4 MG tablet Take 1 tablet (4 mg total) by mouth every 8 (eight) hours as needed for nausea or vomiting. 01/06/15   Danelle BerryLeisa Tapia, PA-C   BP 117/78 mmHg  Pulse 97   Temp(Src) 99.2 F (37.3 C)  Resp 18  Wt 215 lb (97.523 kg)  SpO2 98% Physical Exam  Constitutional: He is oriented to person, place, and time. He appears well-developed and well-nourished. No distress.  HENT:  Head: Normocephalic and atraumatic.  Right Ear: Hearing normal.  Left Ear: Hearing normal.  Nose: Nose normal.  Mouth/Throat: Oropharynx is clear and moist and mucous membranes are normal.  Eyes: Conjunctivae and EOM are normal. Pupils are equal, round, and reactive to light.  Neck: Normal range of motion. Neck supple.  Cardiovascular: Regular rhythm, S1 normal and S2 normal.  Exam reveals no gallop and no friction rub.   No murmur heard. Pulmonary/Chest: Effort normal and breath sounds normal. No respiratory distress. He exhibits no tenderness.  Abdominal: Soft. Normal appearance and bowel sounds are normal. There is no hepatosplenomegaly. There is no tenderness. There is no rebound, no guarding, no tenderness at McBurney's point and negative Murphy's sign. No hernia.  Musculoskeletal: Normal range of motion.  Normal extension of the wrist and fingers, normal abduction and abduction of all fingers. Normal opposition of fingers. Patient has normal sensation of dorsal and volar aspect of all 4 fingers.  Neurological: He is alert and oriented to person, place, and time. He has normal strength. No cranial nerve deficit or sensory deficit. Coordination normal. GCS eye subscore is 4. GCS verbal subscore is 5. GCS motor subscore is 6.  Skin: Skin is warm, dry and intact. No rash noted. No  cyanosis.  3.5 cm curvilinear laceration over distal portion of fifth metacarpal of right hand  Psychiatric: He has a normal mood and affect. His speech is normal and behavior is normal. Thought content normal.  Nursing note and vitals reviewed.   ED Course  Procedures (including critical care time) Labs Review Labs Reviewed - No data to display  Imaging Review No results found. I have personally  reviewed and evaluated these images and lab results as part of my medical decision-making.   EKG Interpretation None      MDM   Final diagnoses:  None   laceration  Patient presents to the ER for evaluation of laceration over the dorsal aspect of his right hand. He reports that he cut his hand on a broken beer bottle. Wound was carefully explored and there was no retained foreign body. Patient has normal sensation, normal range of motion of the fingers, there is no evidence of neurovascular injury or tendon injury. Suture repair was performed. Patient will have sutures removed in 10 days.    Gilda Crease, MD 04/01/15 [phone]

## 2015-04-01 NOTE — ED Notes (Signed)
Rt hand laceration with a beer bottle  No active b leeding

## 2015-04-13 ENCOUNTER — Encounter (HOSPITAL_COMMUNITY): Payer: Self-pay | Admitting: Emergency Medicine

## 2015-04-13 ENCOUNTER — Emergency Department (INDEPENDENT_AMBULATORY_CARE_PROVIDER_SITE_OTHER)
Admission: EM | Admit: 2015-04-13 | Discharge: 2015-04-13 | Disposition: A | Discharge status: Home / Self Care | Payer: Self-pay | Source: Home / Self Care | Attending: Family Medicine | Admitting: Family Medicine

## 2015-04-13 DIAGNOSIS — Z4802 Encounter for removal of sutures: Secondary | ICD-10-CM

## 2015-04-13 NOTE — ED Notes (Signed)
Suture removal kit at bedside 

## 2015-04-13 NOTE — ED Notes (Signed)
Seen 1/1 in emergency department.  Patient is in department tonight for suture removal.  Site unremarkable, healing wound

## 2015-04-13 NOTE — ED Provider Notes (Signed)
CSN: [id number]     Arrival date & time 04/13/15  1821 History   First MD Initiated Contact with Patient 04/13/15 2018     No chief complaint on file.  (Consider location/radiation/quality/duration/timing/severity/associated sxs/prior Treatment) HPI Pt is here for suture removal. Was seen in ER after cutting hand on beer bottle.  No other complaints No past medical history on file. No past surgical history on file. No family history on file. Social History  Substance Use Topics  . Smoking status: Current Every Day Smoker    Types: Cigarettes  . Smokeless tobacco: Not on file  . Alcohol Use: No    Review of Systems ROS +'ve suture removal  Denies: HEADACHE, NAUSEA, ABDOMINAL PAIN, CHEST PAIN, CONGESTION, DYSURIA, SHORTNESS OF BREATH     Allergies  Review of patient's allergies indicates no known allergies.  Home Medications   Prior to Admission medications   Medication Sig Start Date End Date Taking? Authorizing Provider  acetaminophen (TYLENOL) 500 MG tablet Take 500 mg by mouth every 6 (six) hours as needed for mild pain.    Historical Provider, MD  ciprofloxacin (CIPRO) 500 MG tablet Take 1 tablet (500 mg total) by mouth 2 (two) times daily. 01/06/15   Danelle BerryLeisa Tapia, PA-C  metroNIDAZOLE (FLAGYL) 500 MG tablet Take 1 tablet (500 mg total) by mouth 2 (two) times daily. 01/06/15   Danelle BerryLeisa Tapia, PA-C  omeprazole (PRILOSEC) 20 MG capsule Take 1 capsule (20 mg total) by mouth daily. 01/06/15   Danelle BerryLeisa Tapia, PA-C  ondansetron (ZOFRAN) 4 MG tablet Take 1 tablet (4 mg total) by mouth every 8 (eight) hours as needed for nausea or vomiting. 01/06/15   Danelle BerryLeisa Tapia, PA-C   Meds Ordered and Administered this Visit  Medications - No data to display  BP 136/80 mmHg  Pulse 84  Temp(Src) 98.6 F (37 C) (Oral)  Resp 16  SpO2 100% No data found.   Physical Exam  ED Course  .Suture Removal Date/Time: 04/13/2015 8:47 PM Performed by: Tharon AquasPATRICK, Leibish Mcgregor C Authorized by: Bradd CanaryKINDL, JAMES  D Consent: Verbal consent obtained. Consent given by: patient Patient identity confirmed: verbally with patient and arm band Time out: Immediately prior to procedure a "time out" was called to verify the correct patient, procedure, equipment, support staff and site/side marked as required. Body area: upper extremity Location details: right hand Wound Appearance: clean Sutures Removed: 6 Post-removal: antibiotic ointment applied Patient tolerance: Patient tolerated the procedure well with no immediate complications   (including critical care time)  Labs Review Labs Reviewed - No data to display  Imaging Review No results found.   Visual Acuity Review  Right Eye Distance:   Left Eye Distance:   Bilateral Distance:    Right Eye Near:   Left Eye Near:    Bilateral Near:         MDM   1. Encounter for removal of sutures    Sutures removed Discharged home.      Tharon AquasFrank C Koron Godeaux, PA 04/13/15 2048

## 2015-04-13 NOTE — Discharge Instructions (Signed)

## 2016-04-02 IMAGING — US US ABDOMEN LIMITED
1 series · 14 of 23 positions shown · non-contrast
Comparison: CT abdomen pelvis 05/22/2010

CLINICAL DATA: Right upper quadrant and epigastric pain for 5 days.

EXAM:
US ABDOMEN LIMITED - RIGHT UPPER QUADRANT

[Series 1: us abdomen limited · 0.20mm/px · 14 of 23 slices shown]
[im 1/23]
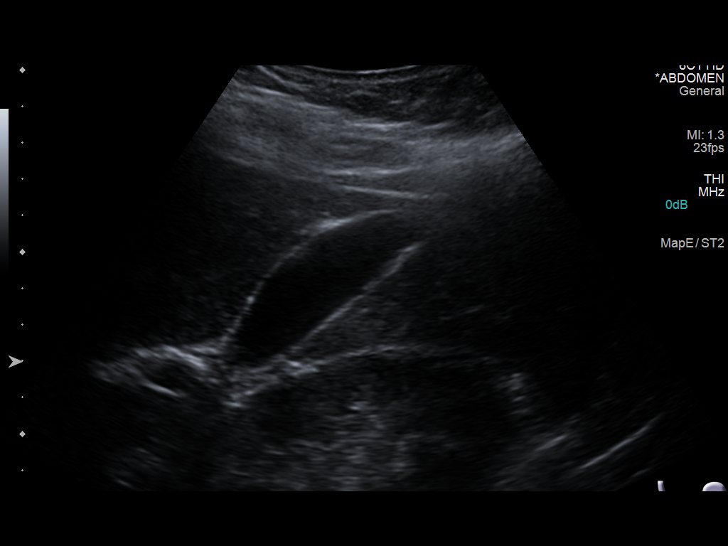
[im 3/23]
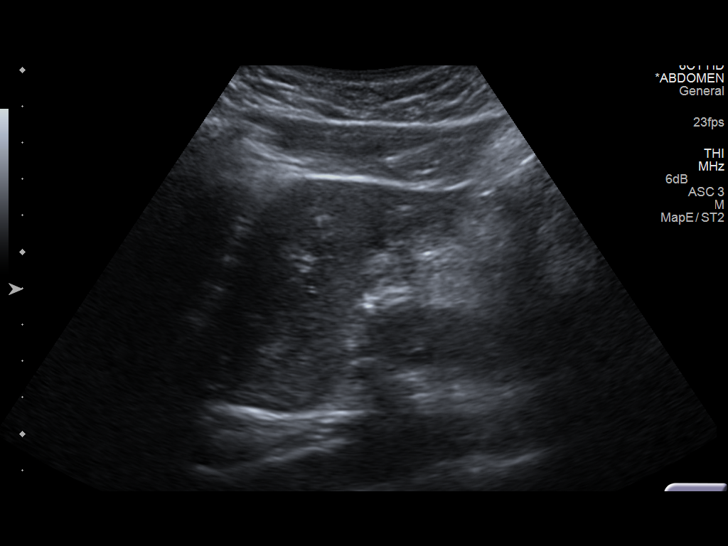
[im 5/23]
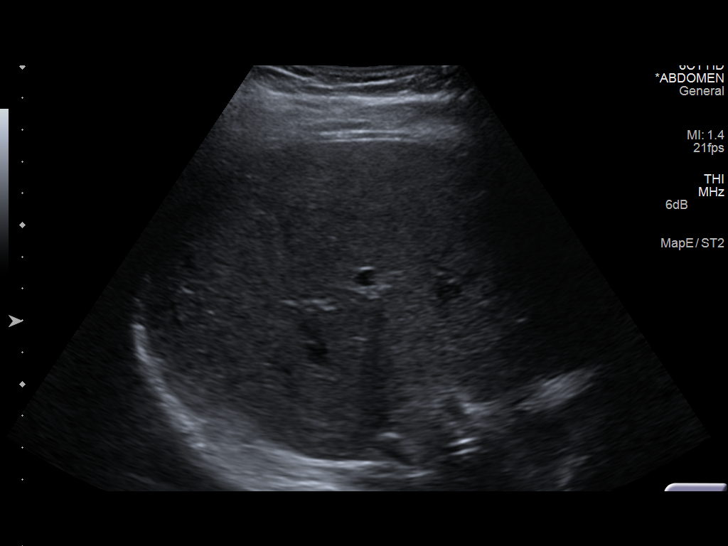
[im 6/23]
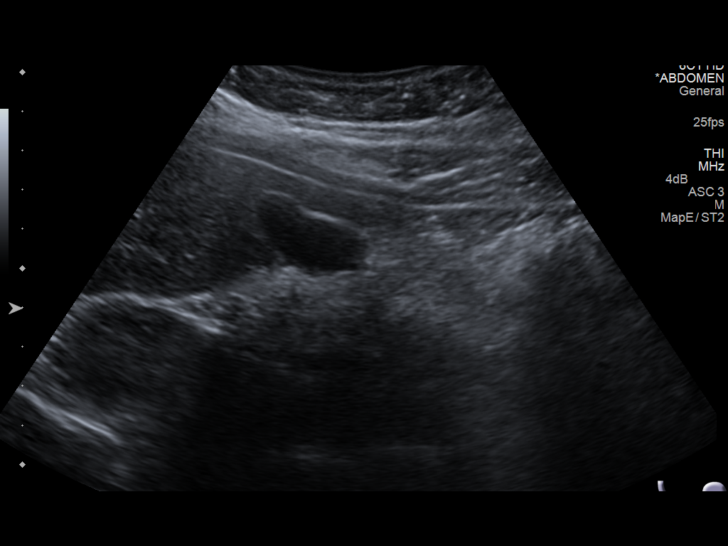
[im 8/23]
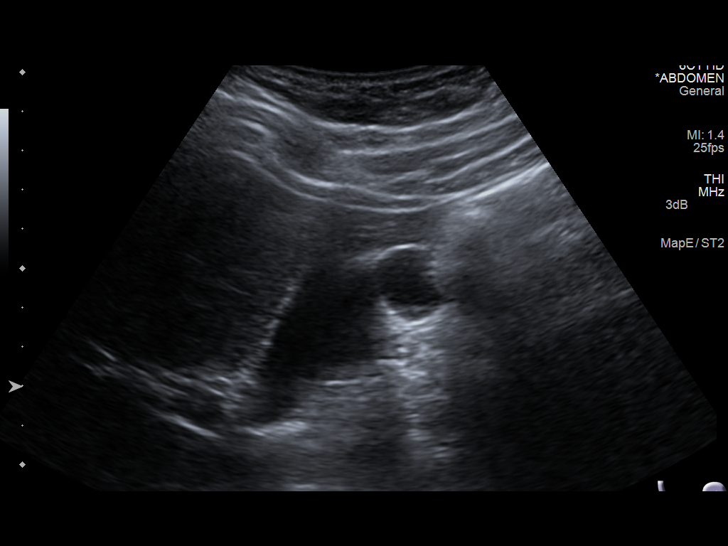
[im 10/23]
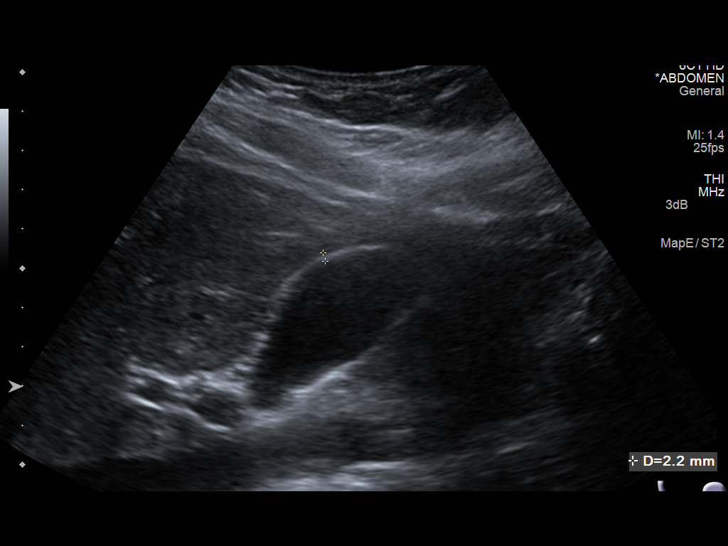
[im 11/23]
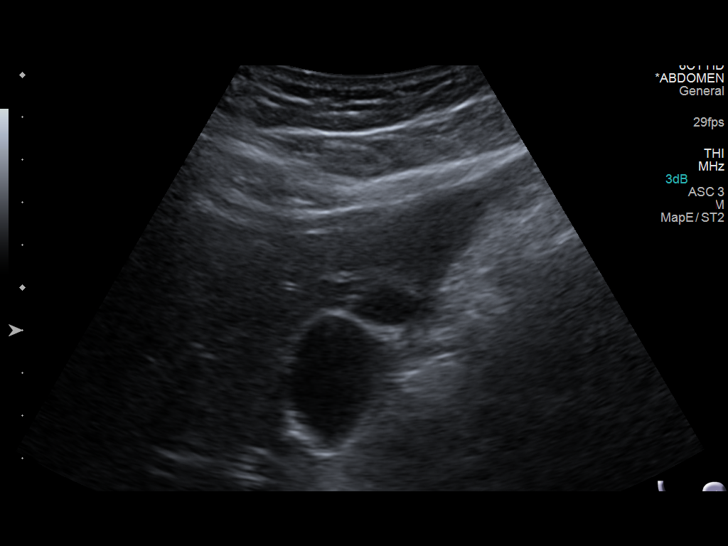
[im 13/23]
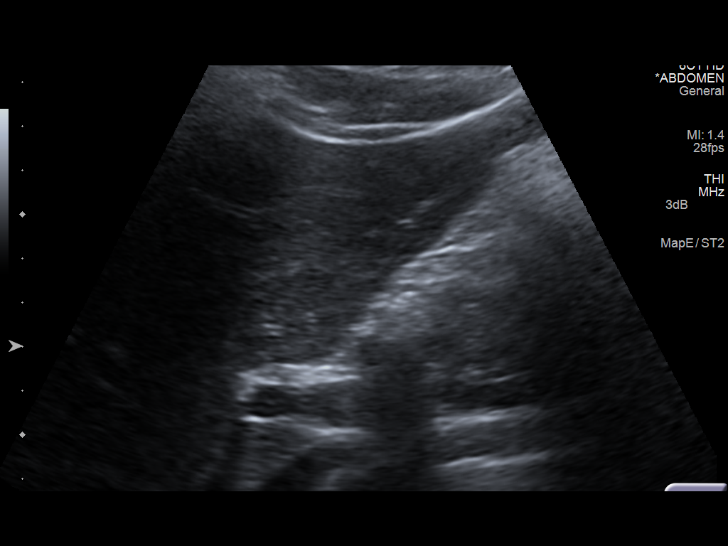
[im 14/23]
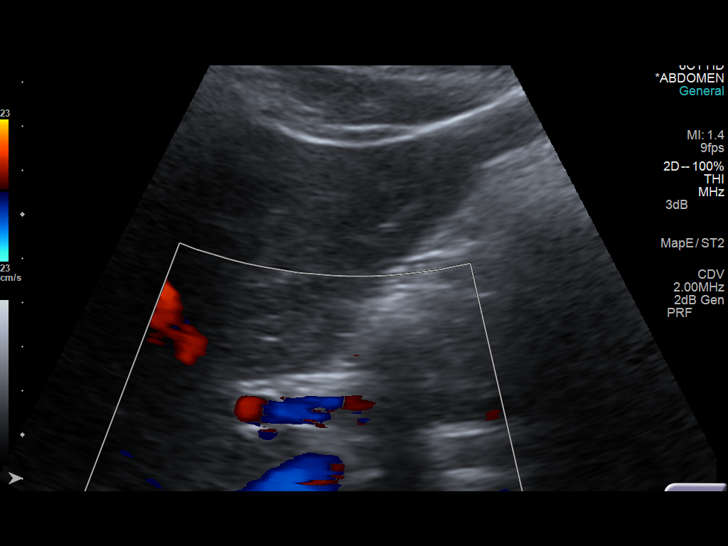
[im 16/23]
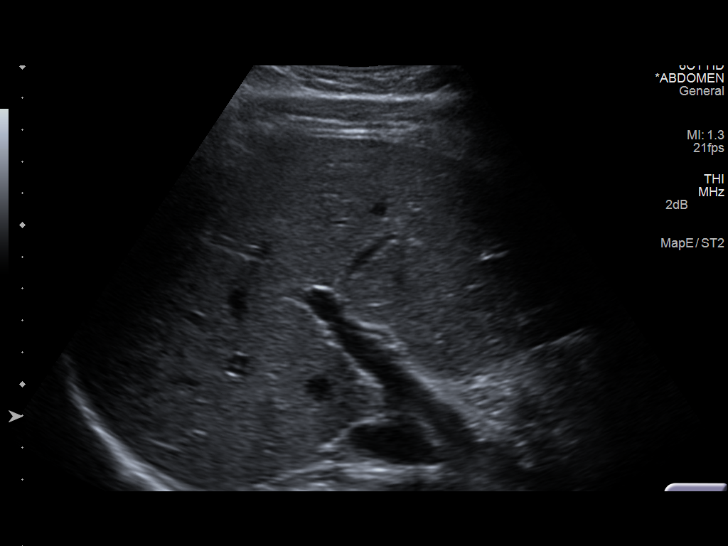
[im 18/23]
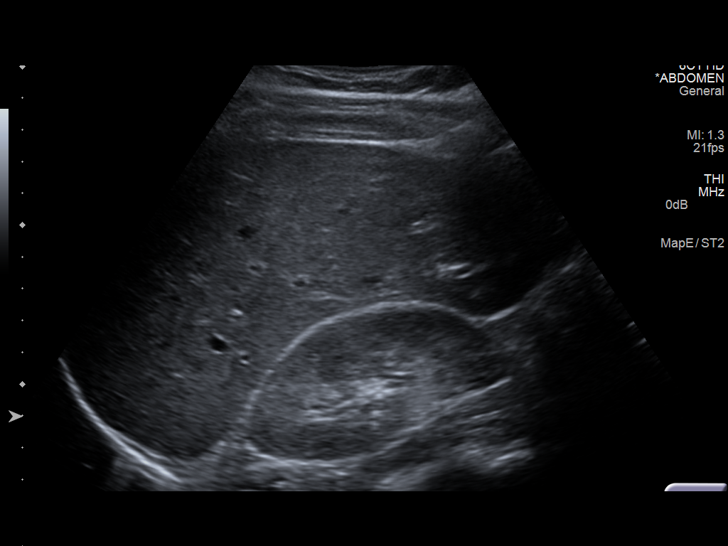
[im 19/23]
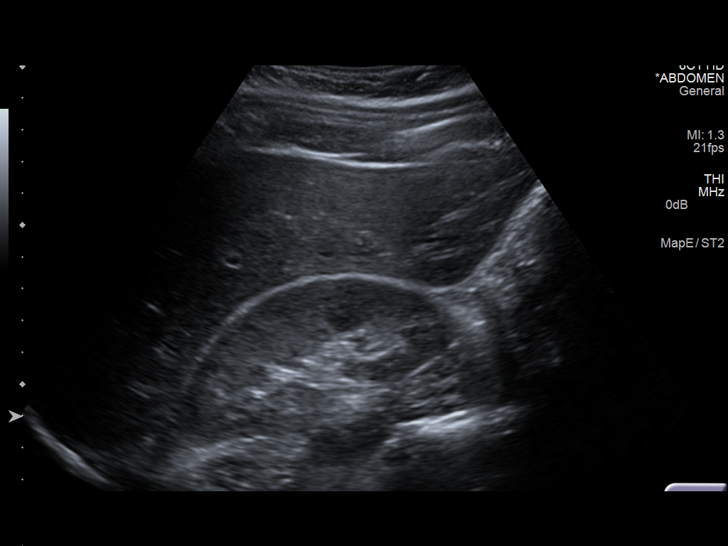
[im 21/23]
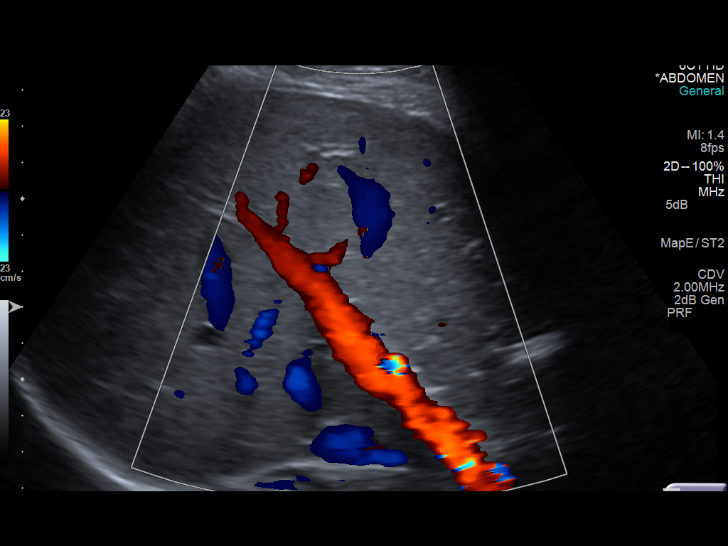
[im 23/23]
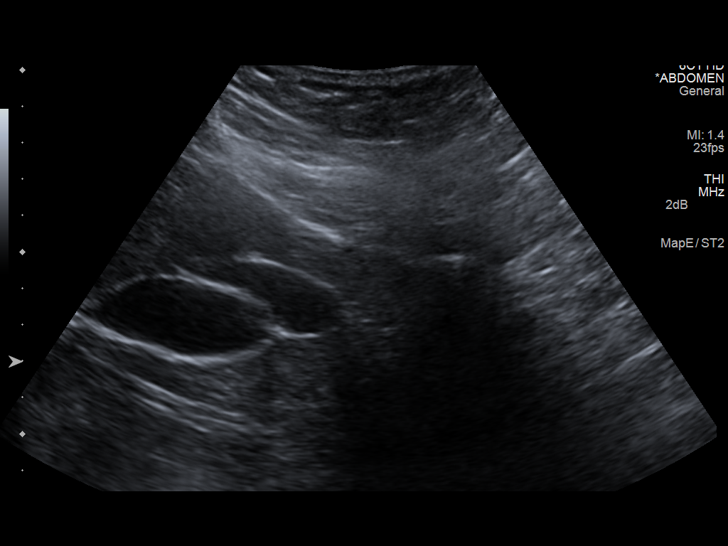

[14 of 23 positions shown; findings below may reference images not displayed]

FINDINGS: Gallbladder:

No gallstones or wall thickening visualized. No sonographic Murphy
sign noted.

Common bile duct:

Diameter: 2 mm

Liver:

No focal lesion identified. Within normal limits in parenchymal
echogenicity.
IMPRESSION: No cholelithiasis or sonographic evidence for acute cholecystitis.

## 2018-03-09 ENCOUNTER — Encounter (HOSPITAL_COMMUNITY): Payer: Self-pay

## 2018-03-09 ENCOUNTER — Ambulatory Visit (HOSPITAL_COMMUNITY)
Admission: EM | Admit: 2018-03-09 | Discharge: 2018-03-09 | Disposition: A | Discharge status: Home / Self Care | Payer: Self-pay | Attending: Internal Medicine | Admitting: Internal Medicine

## 2018-03-09 DIAGNOSIS — H9203 Otalgia, bilateral: Secondary | ICD-10-CM | POA: Insufficient documentation

## 2018-03-09 DIAGNOSIS — F17210 Nicotine dependence, cigarettes, uncomplicated: Secondary | ICD-10-CM | POA: Insufficient documentation

## 2018-03-09 DIAGNOSIS — J00 Acute nasopharyngitis [common cold]: Secondary | ICD-10-CM

## 2018-03-09 LAB — POCT RAPID STREP A: STREPTOCOCCUS, GROUP A SCREEN (DIRECT): NEGATIVE

## 2018-03-09 MED ORDER — IPRATROPIUM BROMIDE 0.06 % NA SOLN: 2.0000 | Freq: Four times a day (QID) | NASAL | 0 refills | Status: DC

## 2018-03-09 MED ORDER — LIDOCAINE VISCOUS HCL 2 % MT SOLN: OROMUCOSAL | 0 refills | Status: DC

## 2018-03-09 MED ORDER — FLUTICASONE PROPIONATE 50 MCG/ACT NA SUSP: 2.0000 | Freq: Every day | NASAL | 0 refills | Status: DC

## 2018-03-09 NOTE — ED Provider Notes (Signed)
[facility]    CSN: [id number] Arrival date & time: 03/09/18  1133     History   Chief Complaint Chief Complaint  Patient presents with  . Sore Throat  . Otalgia    HPI Jordan Carr is a 27 y.o. male.   27 year old male comes in for 2-day history of sore throat and bilateral ear pain.  States trouble swallowing due to pain, without trouble breathing, swelling of the throat, tripoding, drooling, trismus.  When he swallows, he can feel pain radiating up bilateral ears.  Has muffled hearing without drainage.  Denies fever, chills, night sweats.  Denies rhinorrhea, nasal congestion, cough.  States took Mucinex with some relief.  Positive sick contact.  Current everyday smoker.     History reviewed. No pertinent past medical history.  There are no active problems to display for this patient.   History reviewed. No pertinent surgical history.     Home Medications    Prior to Admission medications   Medication Sig Start Date End Date Taking? Authorizing Provider  fluticasone (FLONASE) 50 MCG/ACT nasal spray Place 2 sprays into both nostrils daily. 03/09/18   Cathie Hoops, Aarav Burgett V, PA-C  ipratropium (ATROVENT) 0.06 % nasal spray Place 2 sprays into both nostrils 4 (four) times daily. 03/09/18   Cathie Hoops, Naman Spychalski V, PA-C  lidocaine (XYLOCAINE) 2 % solution 5-15 mL gurgle as needed 03/09/18   Belinda Fisher, PA-C    Family History Family History  Family history unknown: Yes    Social History Social History   Tobacco Use  . Smoking status: Current Every Day Smoker    Types: Cigarettes  . Smokeless tobacco: Current User  Substance Use Topics  . Alcohol use: No  . Drug use: No     Allergies   Patient has no known allergies.   Review of Systems Review of Systems  Reason unable to perform ROS: See HPI as above.     Physical Exam Triage Vital Signs ED Triage Vitals  Enc Vitals Group     BP 03/09/18 1254 114/78     Pulse Rate 03/09/18 1254 76     Resp --      Temp  03/09/18 1254 98.2 F (36.8 C)     Temp Source 03/09/18 1254 Oral     SpO2 03/09/18 1254 100 %     Weight --      Height --      Head Circumference --      Peak Flow --      Pain Score 03/09/18 1256 8     Pain Loc --      Pain Edu? --      Excl. in GC? --    No data found.  Updated Vital Signs BP 114/78 (BP Location: Right Arm)   Pulse 76   Temp 98.2 F (36.8 C) (Oral)   SpO2 100%   Physical Exam  Constitutional: He is oriented to person, place, and time. He appears well-developed and well-nourished.  Non-toxic appearance. He does not appear ill. No distress.  HENT:  Head: Normocephalic and atraumatic.  Right Ear: External ear and ear canal normal. Tympanic membrane is erythematous. Tympanic membrane is not bulging.  Left Ear: External ear and ear canal normal. Tympanic membrane is erythematous. Tympanic membrane is not bulging.  Nose: Nose normal. Right sinus exhibits no maxillary sinus tenderness and no frontal sinus tenderness. Left sinus exhibits no maxillary sinus tenderness and no frontal sinus tenderness.  Mouth/Throat: Uvula is midline, oropharynx  is clear and moist and mucous membranes are normal. No tonsillar exudate.  Eyes: Pupils are equal, round, and reactive to light. Conjunctivae are normal.  Neck: Normal range of motion. Neck supple.  Cardiovascular: Normal rate, regular rhythm and normal heart sounds. Exam reveals no gallop and no friction rub.  No murmur heard. Pulmonary/Chest: Effort normal and breath sounds normal. He has no decreased breath sounds. He has no wheezes. He has no rhonchi. He has no rales.  Lymphadenopathy:    He has no cervical adenopathy.  Neurological: He is alert and oriented to person, place, and time.  Skin: Skin is warm and dry.  Psychiatric: He has a normal mood and affect. His behavior is normal. Judgment normal.     UC Treatments / Results  Labs (all labs ordered are listed, but only abnormal results are displayed) Labs  Reviewed  CULTURE, GROUP A STREP [facility]  POCT RAPID STREP A    EKG None  Radiology No results found.  Procedures Procedures (including critical care time)  Medications Ordered in UC Medications - No data to display  Initial Impression / Assessment and Plan / UC Course  I have reviewed the triage vital signs and the nursing notes.  Pertinent labs & imaging results that were available during my care of the patient were reviewed by me and considered in my medical decision making (see chart for details).    Rapid strep negative. Patient is nontoxic in appearance. Symptomatic treatment as needed. Return precautions given.   Final Clinical Impressions(s) / UC Diagnoses   Final diagnoses:  Acute nasopharyngitis    ED Prescriptions    Medication Sig Dispense Auth. Provider   lidocaine (XYLOCAINE) 2 % solution 5-15 mL gurgle as needed 150 mL Labrea Eccleston V, PA-C   fluticasone (FLONASE) 50 MCG/ACT nasal spray Place 2 sprays into both nostrils daily. 1 g Yailen Zemaitis V, PA-C   ipratropium (ATROVENT) 0.06 % nasal spray Place 2 sprays into both nostrils 4 (four) times daily. 15 mL Threasa AlphaYu, Marilyn Nihiser V, PA-C        Walker Paddack V, [place] JerseyPA-C 03/09/18 1322

## 2018-03-09 NOTE — Discharge Instructions (Signed)
Rapid strep negative. Symptoms are most likely due to viral illness/ drainage down your throat. Start lidocaine for sore throat, do not eat or drink for the next 40 mins after use as it can stunt your gag reflex. Flonase, atrovent for nasal congestion/drainage. You can use over the counter nasal saline rinse such as neti pot for nasal congestion. Monitor for any worsening of symptoms, swelling of the throat, trouble breathing, trouble swallowing, leaning forward to breath, drooling, go to the emergency department for further evaluation needed. ° °For sore throat/cough try using a honey-based tea. Use 3 teaspoons of honey with juice squeezed from half lemon. Place shaved pieces of ginger into 1/2-1 cup of water and warm over stove top. Then mix the ingredients and repeat every 4 hours as needed. °

## 2018-03-09 NOTE — ED Triage Notes (Signed)
Pt presents with throat and both ear pain that started two days ago.

## 2018-03-11 LAB — CULTURE, GROUP A STREP (THRC)

## 2018-03-12 ENCOUNTER — Encounter (HOSPITAL_COMMUNITY): Payer: Self-pay | Admitting: Emergency Medicine

## 2018-03-12 ENCOUNTER — Emergency Department (HOSPITAL_COMMUNITY)
Admission: EM | Admit: 2018-03-12 | Discharge: 2018-03-12 | Disposition: A | Discharge status: Home / Self Care | Payer: Self-pay | Attending: Emergency Medicine | Admitting: Emergency Medicine

## 2018-03-12 DIAGNOSIS — Z79899 Other long term (current) drug therapy: Secondary | ICD-10-CM | POA: Insufficient documentation

## 2018-03-12 DIAGNOSIS — F17210 Nicotine dependence, cigarettes, uncomplicated: Secondary | ICD-10-CM | POA: Insufficient documentation

## 2018-03-12 DIAGNOSIS — J029 Acute pharyngitis, unspecified: Secondary | ICD-10-CM | POA: Insufficient documentation

## 2018-03-12 LAB — GROUP A STREP BY PCR: Group A Strep by PCR: NOT DETECTED

## 2018-03-12 MED ORDER — IBUPROFEN 800 MG PO TABS: 800.0000 mg | ORAL_TABLET | Freq: Three times a day (TID) | ORAL | 0 refills | Status: DC

## 2018-03-12 MED ORDER — IBUPROFEN 400 MG PO TABS
600.0000 mg | ORAL_TABLET | Freq: Once | ORAL | Status: AC
  Administered 2018-03-12: 600 mg via ORAL
  Filled 2018-03-12: qty 1

## 2018-03-12 MED ORDER — DEXAMETHASONE 4 MG PO TABS
10.0000 mg | ORAL_TABLET | Freq: Once | ORAL | Status: AC
  Administered 2018-03-12: 10 mg via ORAL
  Filled 2018-03-12: qty 3

## 2018-03-12 NOTE — Discharge Instructions (Addendum)
Please read and follow all provided instructions.  Your diagnoses today include: Viral Pharyngitis   Tests performed today include: Vital signs. See below for your results today.  Strep Test: This was negative at urgent care. The culture was negative.   Medications prescribed:  For pain control you may take: 800mg  of ibuprofen up to 3 times a day (please take with food) and acetaminophen 975mg  (this is 3 normal strength, 325mg , over the counter pills) up to four times a day. Please do not take more than this. Do not drink alcohol or combine with other medications that have acetaminophen or Ibuprofen as an ingredient (Read the labels!).   Home care instructions:  At this time, it appears that your sore throat is caused by a viral infection. Antibiotics do NOT help a viral infection and can cause unwanted side effects. The fever should resolve in 2-3 days and sore throat should begin to resolve in 2-3 days as well. Continued to alternate between Tylenol and ibuprofen for pain. May consider over-the-counter Benadryl for additional relief (decrease secretions). Also discard your toothbrush and begin using a new one in 3 days.   Follow-up instructions: Please follow-up with your primary care provider in 2-3 days for follow up.   Return instructions:  Return to the ED sooner for worsening condition, inability to swallow, breathing difficulty, new concerns.  Additional Information:  Your vital signs today were: BP 122/76 (BP Location: Right Arm)    Pulse 89    Temp 97.6 F (36.4 C) (Oral)    Resp 12    Ht 6\' 1"  (1.854 m)    Wt 104.3 kg    SpO2 98%    BMI 30.34 kg/m  If your blood pressure (BP) was elevated above 135/85 this visit, please have this repeated by your doctor within one month. ---------------

## 2018-03-12 NOTE — ED Provider Notes (Signed)
[facility] Provider Note   CSN: [id number] Arrival date & time: 03/12/18  [id number]    History   Chief Complaint Chief Complaint  Patient presents with  . Sore Throat    HPI Jordan Carr is a 27 y.o. male with no significant past medical history presents emergency room today for sore throat.  Patient reports that he started experiencing a sore throat with bilateral ear pain on 12/8.  He went to urgent care on 12/10 for this.  He had a negative strep test.  Culture was negative for group A strep.  He reports since that time he has been using his prescribed viscous lidocaine for his sore throat but notes that his symptoms, although improved have not fully gone away.  He reports he has continued pain in his bilateral ears with swallowing, sore throat that he describes as moderate in severity, scratchy, and associates with dysphasia.  Patient has not taken any ibuprofen or Tylenol for this.  He has taken some Mucinex with minimal relief.  He does note sick contact with viral URI.  Patient denies any fever, chills, sinus congestion, sinus pain, cough, neck stiffness, rash, shortness of breath.  HPI  History reviewed. No pertinent past medical history.  There are no active problems to display for this patient.   History reviewed. No pertinent surgical history.      Home Medications    Prior to Admission medications   Medication Sig Start Date End Date Taking? Authorizing Provider  fluticasone (FLONASE) 50 MCG/ACT nasal spray Place 2 sprays into both nostrils daily. 03/09/18   Cathie HoopsYu, Amy V, PA-C  ipratropium (ATROVENT) 0.06 % nasal spray Place 2 sprays into both nostrils 4 (four) times daily. 03/09/18   Cathie HoopsYu, Amy V, PA-C  lidocaine (XYLOCAINE) 2 % solution 5-15 mL gurgle as needed 03/09/18   Belinda FisherYu, Amy V, PA-C    Family History Family History  Family history unknown: Yes    Social History Social History   Tobacco Use  . Smoking status: Current Every  Day Smoker    Types: Cigarettes  . Smokeless tobacco: Current User  Substance Use Topics  . Alcohol use: No  . Drug use: No     Allergies   Patient has no known allergies.   Review of Systems Review of Systems  Constitutional: Negative for fever.  HENT: Positive for ear pain and sore throat. Negative for congestion, ear discharge, rhinorrhea, sinus pressure, sinus pain and trouble swallowing.   Eyes: Negative for discharge, redness and itching.  Respiratory: Negative for shortness of breath.   Cardiovascular: Negative for chest pain.  Gastrointestinal: Negative for abdominal pain, diarrhea, nausea and vomiting.  Skin: Negative for rash.     Physical Exam Updated Vital Signs BP 122/76 (BP Location: Right Arm)   Pulse 89   Temp 97.6 F (36.4 C) (Oral)   Resp 12   Ht 6\' 1"  (1.854 m)   Wt 104.3 kg   SpO2 98%   BMI 30.34 kg/m   Physical Exam Vitals signs and nursing note reviewed.  Constitutional:      Appearance: He is well-developed. He is not diaphoretic.  HENT:     Head: Normocephalic and atraumatic.     Right Ear: Tympanic membrane and external ear normal. No mastoid tenderness. No hemotympanum.     Left Ear: Tympanic membrane and external ear normal. No mastoid tenderness. No hemotympanum.     Ears:     Comments: No mastoid erythema, heat, edema.  No tenderness over the mastoid bones    Nose: Mucosal edema present.     Right Sinus: No maxillary sinus tenderness or frontal sinus tenderness.     Left Sinus: No maxillary sinus tenderness or frontal sinus tenderness.     Mouth/Throat:     Pharynx: Uvula midline.     Tonsils: No tonsillar exudate.     Comments: The patient has normal phonation and is in control of secretions. No stridor.  Midline uvula without edema. Soft palate rises symmetrically.  2+ tonsils with mild  Erythema. No tonsillar exudates. No PTA. Tongue protrusion is normal. No trismus. No creptius on neck palpation and patient has good dentition. No  gingival erythema or fluctuance noted. Mucus membranes moist.  Eyes:     General: No scleral icterus.       Right eye: No discharge.        Left eye: No discharge.     Pupils: Pupils are equal, round, and reactive to light.  Neck:     Musculoskeletal: Neck supple. Normal range of motion. No neck rigidity or spinous process tenderness.     Trachea: Trachea normal.     Comments: No nuchal rigidity or meningismus Cardiovascular:     Rate and Rhythm: Normal rate and regular rhythm.     Pulses:          Radial pulses are 2+ on the right side and 2+ on the left side.       Dorsalis pedis pulses are 2+ on the right side and 2+ on the left side.       Posterior tibial pulses are 2+ on the right side and 2+ on the left side.     Heart sounds: No murmur.  Pulmonary:     Effort: Pulmonary effort is normal.     Breath sounds: Normal breath sounds.  Chest:     Chest wall: No tenderness.  Abdominal:     General: Bowel sounds are normal.     Palpations: Abdomen is soft.     Tenderness: There is no abdominal tenderness. There is no guarding or rebound.  Lymphadenopathy:     Cervical: No cervical adenopathy.  Skin:    General: Skin is warm and dry.     Findings: No rash. Rash is not vesicular.  Neurological:     Mental Status: He is alert.      ED Treatments / Results  Labs (all labs ordered are listed, but only abnormal results are displayed) Labs Reviewed  GROUP A STREP BY PCR    EKG None  Radiology No results found.  Procedures Procedures (including critical care time)  Medications Ordered in ED Medications  dexamethasone (DECADRON) tablet 10 mg (has no administration in time range)  ibuprofen (ADVIL,MOTRIN) tablet 600 mg (has no administration in time range)     Initial Impression / Assessment and Plan / ED Course  I have reviewed the triage vital signs and the nursing notes.  Pertinent labs & imaging results that were available during my care of the patient were  reviewed by me and considered in my medical decision making (see chart for details).     27 y.o. male with sore throat. Pt afebrile without tonsillar exudate, negative strep at urgent care on 12/10. Presents with mild cervical lymphadenopathy, & dysphagia; diagnosis of viral pharyngitis. No abx indicated. Discussed tx with anti-inflammatories like ibuprofen. Patient was given decadron in the department.  Pt does not appear dehydrated, but did discuss importance of water  rehydration. Presentation non concerning for PTA or RPA. No trismus or uvula deviation. Specific return precautions discussed. Pt able to drink water in ED without difficulty with intact air way. Recommended PCP follow up.  Final Clinical Impressions(s) / ED Diagnoses   Final diagnoses:  Sore throat    ED Discharge Orders         Ordered    ibuprofen (ADVIL,MOTRIN) 800 MG tablet  3 times daily     03/12/18 0940           Princella Pellegrini 03/12/18 0941    Eber Hong, MD 03/13/18 [phone]

## 2018-03-12 NOTE — ED Notes (Signed)
Pt verbalized understanding of discharge instructions and denies any further questions at this time.   

## 2018-03-12 NOTE — ED Triage Notes (Signed)
Pt here with c/o sore throat and ear ache , [facility] , pt state that he is not feeling better

## 2018-09-27 ENCOUNTER — Other Ambulatory Visit: Payer: Self-pay

## 2018-09-27 ENCOUNTER — Ambulatory Visit (HOSPITAL_COMMUNITY)
Admission: EM | Admit: 2018-09-27 | Discharge: 2018-09-27 | Disposition: A | Discharge status: Home / Self Care | Payer: Self-pay | Attending: Family Medicine | Admitting: Family Medicine

## 2018-09-27 ENCOUNTER — Encounter (HOSPITAL_COMMUNITY): Payer: Self-pay | Admitting: Emergency Medicine

## 2018-09-27 DIAGNOSIS — L810 Postinflammatory hyperpigmentation: Secondary | ICD-10-CM

## 2018-09-27 HISTORY — DX: Cutaneous abscess, unspecified: L02.91

## 2018-09-27 NOTE — ED Triage Notes (Signed)
Patient has a history of abscess.  Currently reports abscess to left thigh.  Patient reports he has had wounds oozing green in the past.

## 2018-09-27 NOTE — Discharge Instructions (Signed)
Use hot compresses whenever you feel a boil coming on. If it bursts, keep is clean/dry and continue hot compress use.

## 2018-09-27 NOTE — ED Provider Notes (Signed)
[facility]    CSN: [id number] Arrival date & time: 09/27/18  1519     History   Chief Complaint Chief Complaint  Patient presents with  . Abscess  . Appointment    3:30    HPI Jordan Carr is a 28 y.o. male presenting for acute concern of recurrent left thigh abscess.  Patient states that he is gotten these "every 3 months or so for the last year.  Patient states that he tends to "mash on them until pus comes out ".  Patient states purulent discharge will be either white or green in color.  Patient denies known history of MRSA infection.  Patient states that he has had to had his boil and incised and drained once in the past.  Patient denies active boil, states that he had one a few weeks ago that he already "busted ".  Patient denies fever, malaise, arthralgias or myalgias.   Past Medical History:  Diagnosis Date  . Abscess     There are no active problems to display for this patient.   History reviewed. No pertinent surgical history.     Home Medications    Prior to Admission medications   Medication Sig Start Date End Date Taking? Authorizing Provider  fluticasone (FLONASE) 50 MCG/ACT nasal spray Place 2 sprays into both nostrils daily. 03/09/18   Cathie HoopsYu, Amy V, PA-C  ibuprofen (ADVIL,MOTRIN) 800 MG tablet Take 1 tablet (800 mg total) by mouth 3 (three) times daily. 03/12/18   Maczis, Elmer SowMichael M, PA-C  ipratropium (ATROVENT) 0.06 % nasal spray Place 2 sprays into both nostrils 4 (four) times daily. 03/09/18   Belinda FisherYu, Amy V, PA-C    Family History Family History  Family history unknown: Yes    Social History Social History   Tobacco Use  . Smoking status: Current Every Day Smoker    Types: Cigarettes  . Smokeless tobacco: Current User  Substance Use Topics  . Alcohol use: No  . Drug use: No     Allergies   Patient has no known allergies.   Review of Systems As per HPI   Physical Exam Triage Vital Signs ED Triage Vitals  Enc Vitals Group   BP      Pulse      Resp      Temp      Temp src      SpO2      Weight      Height      Head Circumference      Peak Flow      Pain Score      Pain Loc      Pain Edu?      Excl. in GC?    No data found.  Updated Vital Signs BP 110/76 (BP Location: Left Arm)   Pulse 74   Temp 98.4 F (36.9 C) (Oral)   Resp 18   SpO2 96%   Visual Acuity Right Eye Distance:   Left Eye Distance:   Bilateral Distance:    Right Eye Near:   Left Eye Near:    Bilateral Near:     Physical Exam Constitutional:      General: He is not in acute distress. HENT:     Head: Normocephalic and atraumatic.  Eyes:     General: No scleral icterus.    Pupils: Pupils are equal, round, and reactive to light.  Cardiovascular:     Rate and Rhythm: Normal rate.  Pulmonary:  Effort: Pulmonary effort is normal.  Skin:    Coloration: Skin is not jaundiced or pale.     Comments: Patient with small (less than 0.5 cm) area of hyperpigmentation with postinflammatory changes to the subcutaneous tissue.  Pain, warmth, erythema or fluctuance.  Neurological:     Mental Status: He is alert and oriented to person, place, and time.      UC Treatments / Results  Labs (all labs ordered are listed, but only abnormal results are displayed) Labs Reviewed - No data to display  EKG None  Radiology No results found.  Procedures Procedures (including critical care time)  Medications Ordered in UC Medications - No data to display  Initial Impression / Assessment and Plan / UC Course  I have reviewed the triage vital signs and the nursing notes.  Pertinent labs & imaging results that were available during my care of the patient were reviewed by me and considered in my medical decision making (see chart for details).     28 year old male with concern for recurrent left thigh abscess.  No active lesion today, there is a small (<0.5 cm) area of postinflammatory changes with hyperpigmentation, consistent  with resolving abscess.  No fluctuance, induration.  Leg is nontender palpation.  Discussed hot compress regimen for when he feels a boil forming as well as return precautions, patient verbalized understanding. Final Clinical Impressions(s) / UC Diagnoses   Final diagnoses:  Post-inflammatory hyperpigmentation     Discharge Instructions     Use hot compresses whenever you feel a boil coming on. If it bursts, keep is clean/dry and continue hot compress use.    ED Prescriptions    None     Controlled Substance Prescriptions [place] Controlled Substance Registry consulted? Not Applicable   Quincy Sheehan, [place] 09/27/18 1657

## 2018-11-12 ENCOUNTER — Encounter (HOSPITAL_COMMUNITY): Payer: Self-pay | Admitting: Emergency Medicine

## 2018-11-12 ENCOUNTER — Ambulatory Visit (HOSPITAL_COMMUNITY)
Admission: EM | Admit: 2018-11-12 | Discharge: 2018-11-12 | Disposition: A | Discharge status: Home / Self Care | Payer: Self-pay

## 2018-11-12 ENCOUNTER — Other Ambulatory Visit: Payer: Self-pay

## 2018-11-12 DIAGNOSIS — M545 Low back pain, unspecified: Secondary | ICD-10-CM

## 2018-11-12 DIAGNOSIS — Z202 Contact with and (suspected) exposure to infections with a predominantly sexual mode of transmission: Secondary | ICD-10-CM | POA: Insufficient documentation

## 2018-11-12 MED ORDER — NAPROXEN 500 MG PO TABS: 500.0000 mg | ORAL_TABLET | Freq: Two times a day (BID) | ORAL | 0 refills | Status: DC

## 2018-11-12 MED ORDER — AZITHROMYCIN 250 MG PO TABS
1000.0000 mg | ORAL_TABLET | Freq: Once | ORAL | Status: AC
  Administered 2018-11-12: 1000 mg via ORAL

## 2018-11-12 MED ORDER — AZITHROMYCIN 250 MG PO TABS
ORAL_TABLET | ORAL | Status: AC
  Filled 2018-11-12: qty 4

## 2018-11-12 MED ORDER — CYCLOBENZAPRINE HCL 5 MG PO TABS: 5.0000 mg | ORAL_TABLET | Freq: Three times a day (TID) | ORAL | 0 refills | Status: DC | PRN

## 2018-11-12 NOTE — ED Triage Notes (Signed)
PT has side and back pain. A partner was positive for chlamydia.

## 2018-11-12 NOTE — ED Provider Notes (Signed)
MRN: [id number] DOB: 10/17/1990  Subjective:   Jordan Carr is a 28 y.o. male presenting for exposure to STI and back pain.  Patient reports that 1 of his sex partners tested positive for chlamydia and needs to be treated.  He has also had moderate intermittent low back pain that radiates to his left lateral side.  Patient used to work for [employer] but had to leave due to having back pain.  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  This is also strenuous work for him.  No current facility-administered medications for this encounter.   Current Outpatient Medications:  .  omeprazole (PRILOSEC) 20 MG capsule, Take 20 mg by mouth daily., Disp: , Rfl:  .  fluticasone (FLONASE) 50 MCG/ACT nasal spray, Place 2 sprays into both nostrils daily., Disp: 1 g, Rfl: 0 .  ibuprofen (ADVIL,MOTRIN) 800 MG tablet, Take 1 tablet (800 mg total) by mouth 3 (three) times daily., Disp: 21 tablet, Rfl: 0 .  ipratropium (ATROVENT) 0.06 % nasal spray, Place 2 sprays into both nostrils 4 (four) times daily., Disp: 15 mL, Rfl: 0   No Known Allergies  Past Medical History:  Diagnosis Date  . Abscess      History reviewed. No pertinent surgical history.  ROS Denies fever, nausea, vomiting, hematuria, dysuria, urinary frequency, penile lesions, penile pain, testicular, pain testicular swelling, falls, trauma, history of back issues or surgeries.  Objective:   Vitals: BP 121/76   Pulse 80   Temp 98.3 F (36.8 C) (Oral)   Resp 16   SpO2 98%   Physical Exam Constitutional:      Appearance: Normal appearance. He is well-developed and normal weight.  HENT:     Head: Normocephalic and atraumatic.     Right Ear: External ear normal.     Left Ear: External ear normal.     Nose: Nose normal.     Mouth/Throat:     Pharynx: Oropharynx is clear.  Eyes:     Extraocular Movements: Extraocular movements intact.     Pupils: Pupils are equal, round, and reactive to light.  Cardiovascular:     Rate and Rhythm:  Normal rate.  Pulmonary:     Effort: Pulmonary effort is normal.  Musculoskeletal:     Lumbar back: He exhibits tenderness (Over areas depicted) and spasm. He exhibits normal range of motion, no bony tenderness, no swelling, no edema and no deformity.       Back:  Skin:    General: Skin is warm and dry.  Neurological:     Mental Status: He is alert and oriented to person, place, and time.     Deep Tendon Reflexes: Reflexes normal.  Psychiatric:        Mood and Affect: Mood normal.        Behavior: Behavior normal.        Thought Content: Thought content normal.        Judgment: Judgment normal.     Assessment and Plan :   1. Exposure to STD   2. Acute left-sided low back pain without sciatica     We will treat empirically for chlamydia with azithromycin here in clinic.  Our conservative management for musculoskeletal type back pain related to the nature of his work.  Use naproxen and Flexeril for this.  Counseled on modification of his work activities, recommended he hydrate more consistently as well. Counseled patient on potential for adverse effects with medications prescribed/recommended today, ER and return-to-clinic precautions discussed, patient  verbalized understanding.    Wallis BambergMani, Tevion Laforge, [place] 11/12/18 1639

## 2018-11-12 NOTE — Discharge Instructions (Addendum)
Avoid all forms of sexual intercourse (oral, vaginal, anal) for the next 7 days to avoid spreading/reinfecting. Return if symptoms worsen/do not resolve, you develop fever, abdominal pain, blood in your urine, or are re-exposed to an STI.  

## 2018-11-16 LAB — URINE CYTOLOGY ANCILLARY ONLY
Chlamydia: NEGATIVE
Neisseria Gonorrhea: NEGATIVE
Trichomonas: NEGATIVE

## 2019-03-21 ENCOUNTER — Ambulatory Visit (HOSPITAL_COMMUNITY)
Admission: EM | Admit: 2019-03-21 | Discharge: 2019-03-21 | Disposition: A | Discharge status: Home / Self Care | Payer: Self-pay | Attending: Family Medicine | Admitting: Family Medicine

## 2019-03-21 ENCOUNTER — Encounter (HOSPITAL_COMMUNITY): Payer: Self-pay

## 2019-03-21 ENCOUNTER — Other Ambulatory Visit: Payer: Self-pay

## 2019-03-21 DIAGNOSIS — M7918 Myalgia, other site: Secondary | ICD-10-CM

## 2019-03-21 MED ORDER — CYCLOBENZAPRINE HCL 10 MG PO TABS: 10.0000 mg | ORAL_TABLET | Freq: Two times a day (BID) | ORAL | 0 refills | Status: DC | PRN

## 2019-03-21 MED ORDER — PREDNISONE 5 MG PO TABS: ORAL_TABLET | ORAL | 0 refills | Status: DC

## 2019-03-21 NOTE — Discharge Instructions (Signed)
Please try the exercises  Please try heat  Please try getting a lacrosse ball and massaging the area Please follow up if your symptoms fail to improve.

## 2019-03-21 NOTE — ED Provider Notes (Signed)
[facility]    CSN: [id number] Arrival date & time: 03/21/19  1715      History   Chief Complaint Chief Complaint  Patient presents with  . Back Pain  . Hip Pain    left    HPI Jordan Carr is a 28 y.o. male.   He is presenting with mid back pain.  The pain is been ongoing for a few days.  The pain is worse with certain movements and thinks he does not work.  It is on the right paraspinal thoracic region.  The pain is sharp and stabbing.  Is worse with transitioning from sitting to standing.  No specific inciting event.  Tries to work out the morning.  Has not take anything for the pain.  No recent fevers or travel.  Pain can be moderate to severe.  HPI  Past Medical History:  Diagnosis Date  . Abscess     There are no problems to display for this patient.   History reviewed. No pertinent surgical history.     Home Medications    Prior to Admission medications   Medication Sig Start Date End Date Taking? Authorizing Provider  cyclobenzaprine (FLEXERIL) 10 MG tablet Take 1 tablet (10 mg total) by mouth 2 (two) times daily as needed for muscle spasms. 03/21/19   Myra Rude, MD  fluticasone (FLONASE) 50 MCG/ACT nasal spray Place 2 sprays into both nostrils daily. 03/09/18   Cathie Hoops, Amy V, PA-C  ibuprofen (ADVIL,MOTRIN) 800 MG tablet Take 1 tablet (800 mg total) by mouth 3 (three) times daily. 03/12/18   Maczis, Elmer Sow, PA-C  ipratropium (ATROVENT) 0.06 % nasal spray Place 2 sprays into both nostrils 4 (four) times daily. 03/09/18   Cathie Hoops, Amy V, PA-C  naproxen (NAPROSYN) 500 MG tablet Take 1 tablet (500 mg total) by mouth 2 (two) times daily. 11/12/18   Wallis Bamberg, PA-C  omeprazole (PRILOSEC) 20 MG capsule Take 20 mg by mouth daily.    [provider]  predniSONE (DELTASONE) 5 MG tablet Take 6 pills for first day, 5 pills second day, 4 pills third day, 3 pills fourth day, 2 pills the fifth day, and 1 pill sixth day. 03/21/19   Myra Rude, MD     Family History Family History  Family history unknown: Yes    Social History Social History   Tobacco Use  . Smoking status: Current Every Day Smoker    Types: Cigarettes  . Smokeless tobacco: Current User  Substance Use Topics  . Alcohol use: No  . Drug use: No     Allergies   Patient has no known allergies.   Review of Systems Review of Systems  Constitutional: Negative for fever.  HENT: Negative for congestion.   Respiratory: Negative for cough.   Cardiovascular: Negative for chest pain.  Gastrointestinal: Negative for abdominal pain.  Musculoskeletal: Positive for back pain.  Skin: Negative for color change.  Neurological: Negative for weakness.  Hematological: Negative for adenopathy.     Physical Exam Triage Vital Signs ED Triage Vitals  Enc Vitals Group     BP 03/21/19 1757 116/65     Pulse Rate 03/21/19 1757 79     Resp 03/21/19 1757 16     Temp 03/21/19 1757 98.6 F (37 C)     Temp Source 03/21/19 1757 Oral     SpO2 03/21/19 1757 100 %     Weight --      Height --  Head Circumference --      Peak Flow --      Pain Score 03/21/19 1755 10     Pain Loc --      Pain Edu? --      Excl. in [place]? --    No data found.  Updated Vital Signs BP 116/65 (BP Location: Right Arm)   Pulse 79   Temp 98.6 F (37 C) (Oral)   Resp 16   SpO2 100%   Visual Acuity Right Eye Distance:   Left Eye Distance:   Bilateral Distance:    Right Eye Near:   Left Eye Near:    Bilateral Near:     Physical Exam Gen: NAD, alert, cooperative with exam, well-appearing ENT: normal lips, normal nasal mucosa,  Eye: normal EOM, normal conjunctiva and lids CV:  no edema, +2 pedal pulses   Resp: no accessory muscle use, non-labored,  Skin: no rashes, no areas of induration  Neuro: normal tone, normal sensation to touch Psych:  normal insight, alert and oriented MSK:  Back: Tenderness to palpation of the right paraspinal muscle just inferior to the inferior  border of the scapula. No winging of the scapula. Normal shoulder range of motion. No tenderness to midline thoracic or lumbar spine. Normal range of motion. Neurovascular intact   UC Treatments / Results  Labs (all labs ordered are listed, but only abnormal results are displayed) Labs Reviewed - No data to display  EKG   Radiology No results found.  Procedures Procedures (including critical care time)  Medications Ordered in UC Medications - No data to display  Initial Impression / Assessment and Plan / UC Course  I have reviewed the triage vital signs and the nursing notes.  Pertinent labs & imaging results that were available during my care of the patient were reviewed by me and considered in my medical decision making (see chart for details).     Jordan Carr is a 28 year old male that is presenting with myofascial type pain over the right paraspinal muscle in the thoracic region.  Likely muscle spasm related.  Provided prednisone and Flexeril.  Counseled on home exercise therapy and supportive care.  Can consider trigger point injections or physical therapy.  May need to have imaging done at some point.  Given indications follow-up and return.  Final Clinical Impressions(s) / UC Diagnoses   Final diagnoses:  Myofascial pain     Discharge Instructions     Please try the exercises  Please try heat  Please try getting a lacrosse ball and massaging the area Please follow up if your symptoms fail to improve.     ED Prescriptions    Medication Sig Dispense Auth. Provider   predniSONE (DELTASONE) 5 MG tablet Take 6 pills for first day, 5 pills second day, 4 pills third day, 3 pills fourth day, 2 pills the fifth day, and 1 pill sixth day. 21 tablet Rosemarie Ax, MD   cyclobenzaprine (FLEXERIL) 10 MG tablet Take 1 tablet (10 mg total) by mouth 2 (two) times daily as needed for muscle spasms. 20 tablet Rosemarie Ax, MD     PDMP not reviewed this encounter.    Rosemarie Ax, MD 03/21/19 Lurline Hare

## 2019-03-21 NOTE — ED Triage Notes (Signed)
Pt present back and left side pain. Symptom started two days ago.

## 2019-03-22 ENCOUNTER — Emergency Department (HOSPITAL_COMMUNITY): Payer: Self-pay

## 2019-03-22 ENCOUNTER — Encounter (HOSPITAL_COMMUNITY): Payer: Self-pay

## 2019-03-22 ENCOUNTER — Emergency Department (HOSPITAL_COMMUNITY)
Admission: EM | Admit: 2019-03-22 | Discharge: 2019-03-22 | Disposition: A | Discharge status: Home / Self Care | Payer: Self-pay | Attending: Emergency Medicine | Admitting: Emergency Medicine

## 2019-03-22 ENCOUNTER — Other Ambulatory Visit: Payer: Self-pay

## 2019-03-22 DIAGNOSIS — M549 Dorsalgia, unspecified: Secondary | ICD-10-CM | POA: Insufficient documentation

## 2019-03-22 DIAGNOSIS — F17210 Nicotine dependence, cigarettes, uncomplicated: Secondary | ICD-10-CM | POA: Insufficient documentation

## 2019-03-22 DIAGNOSIS — Z79899 Other long term (current) drug therapy: Secondary | ICD-10-CM | POA: Insufficient documentation

## 2019-03-22 DIAGNOSIS — R0781 Pleurodynia: Secondary | ICD-10-CM | POA: Insufficient documentation

## 2019-03-22 DIAGNOSIS — M7918 Myalgia, other site: Secondary | ICD-10-CM

## 2019-03-22 DIAGNOSIS — R05 Cough: Secondary | ICD-10-CM | POA: Insufficient documentation

## 2019-03-22 DIAGNOSIS — Z20828 Contact with and (suspected) exposure to other viral communicable diseases: Secondary | ICD-10-CM | POA: Insufficient documentation

## 2019-03-22 NOTE — ED Provider Notes (Signed)
[facility] DEPT Provider Note   CSN: [id number] Arrival date & time: 03/22/19  1418     History Chief Complaint  Patient presents with  . Rib cage pain    Jordan Carr is a 28 y.o. male.  Patient presents with complaint of thoracic back pain ongoing for the past 3 to 4 days.  Also pain to his left lateral lower ribs.  Pain is worse with movement and palpation.  Patient has a physically demanding job.  He denies any chest pain or shortness of breath.  He has been coughing up some phlegm.  No fevers or URI symptoms.  He does note some coronavirus contacts in the past 2 to 3 weeks.  No GI symptoms.  Patient was seen at urgent care yesterday.  He was prescribed Flexeril and prednisone.  He states that he will not fill these.  He is not happy with his treatment in urgent care because they did not do any tests.  This is when he states that he also needs to be tested for coronavirus.  He has not taken any medicines at home.  Patient denies warning symptoms of back pain including: fecal incontinence, urinary retention or overflow incontinence, night sweats, waking from sleep with back pain, unexplained fevers or weight loss, h/o cancer, IVDU, recent trauma.           Past Medical History:  Diagnosis Date  . Abscess     There are no problems to display for this patient.   History reviewed. No pertinent surgical history.     Family History  Family history unknown: Yes    Social History   Tobacco Use  . Smoking status: Current Every Day Smoker    Types: Cigarettes  . Smokeless tobacco: Current User  Substance Use Topics  . Alcohol use: No  . Drug use: No    Home Medications Prior to Admission medications   Medication Sig Start Date End Date Taking? Authorizing Provider  cyclobenzaprine (FLEXERIL) 10 MG tablet Take 1 tablet (10 mg total) by mouth 2 (two) times daily as needed for muscle spasms. 03/21/19   Rosemarie Ax, MD  fluticasone  (FLONASE) 50 MCG/ACT nasal spray Place 2 sprays into both nostrils daily. 03/09/18   Tasia Catchings, Amy V, PA-C  ibuprofen (ADVIL,MOTRIN) 800 MG tablet Take 1 tablet (800 mg total) by mouth 3 (three) times daily. 03/12/18   Maczis, Barth Kirks, PA-C  ipratropium (ATROVENT) 0.06 % nasal spray Place 2 sprays into both nostrils 4 (four) times daily. 03/09/18   Tasia Catchings, Amy V, PA-C  naproxen (NAPROSYN) 500 MG tablet Take 1 tablet (500 mg total) by mouth 2 (two) times daily. 11/12/18   Jaynee Eagles, PA-C  omeprazole (PRILOSEC) 20 MG capsule Take 20 mg by mouth daily.    [provider]  predniSONE (DELTASONE) 5 MG tablet Take 6 pills for first day, 5 pills second day, 4 pills third day, 3 pills fourth day, 2 pills the fifth day, and 1 pill sixth day. 03/21/19   Rosemarie Ax, MD    Allergies    Patient has no known allergies.  Review of Systems   Review of Systems  Constitutional: Negative for fever and unexpected weight change.  Respiratory: Positive for cough. Negative for chest tightness.   Cardiovascular: Positive for chest pain (Rib pain).  Gastrointestinal: Negative for constipation.       Neg for fecal incontinence  Genitourinary: Negative for difficulty urinating, flank pain and hematuria.  Negative for urinary incontinence or retention  Musculoskeletal: Positive for back pain.  Neurological: Negative for weakness and numbness.       Negative for saddle paresthesias     Physical Exam Updated Vital Signs BP 131/82 (BP Location: Right Arm)   Pulse 80   Temp 98.6 F (37 C) (Oral)   Resp 16   Ht 6\' 1"  (1.854 m)   Wt 102.1 kg   SpO2 99%   BMI 29.69 kg/m   Physical Exam Vitals and nursing note reviewed.  Constitutional:      Appearance: He is well-developed.  HENT:     Head: Normocephalic and atraumatic.  Eyes:     Conjunctiva/sclera: Conjunctivae normal.  Chest:     Chest wall: Tenderness (Left inferior lateral ribs, mild to palpation) present.  Abdominal:     Palpations:  Abdomen is soft.     Tenderness: There is no abdominal tenderness.  Musculoskeletal:        General: No tenderness. Normal range of motion.     Cervical back: Normal and normal range of motion.     Thoracic back: Spasms (Mid T-spine, paraspinous) present. No swelling, edema, deformity or tenderness. Normal range of motion.     Lumbar back: Normal.     Comments: No step-off noted with palpation of spine.   Skin:    General: Skin is warm and dry.  Neurological:     Mental Status: He is alert.     Sensory: No sensory deficit.     Motor: No abnormal muscle tone.     Deep Tendon Reflexes: Reflexes are normal and symmetric.     Comments: 5/5 strength in entire lower extremities bilaterally. No sensation deficit.      ED Results / Procedures / Treatments   Labs (all labs ordered are listed, but only abnormal results are displayed) Labs Reviewed  NOVEL CORONAVIRUS, NAA (HOSP ORDER, SEND-OUT TO REF LAB; TAT 18-24 HRS)    EKG None  Radiology DG Ribs Unilateral W/Chest Left  Result Date: 03/22/2019 CLINICAL DATA:  Back and left rib pain for 4 days.  No known injury. EXAM: LEFT RIBS AND CHEST - 3+ VIEW COMPARISON:  None. FINDINGS: Single-view of the chest demonstrates clear lungs and normal heart size. No pneumothorax or pleural fluid. No fracture or other bony abnormality is identified. IMPRESSION: Normal exam. Electronically Signed   By: 03/24/2019 M.D.   On: 03/22/2019 16:53    Procedures Procedures (including critical care time)  Medications Ordered in ED Medications - No data to display  ED Course  I have reviewed the triage vital signs and the nursing notes.  Pertinent labs & imaging results that were available during my care of the patient were reviewed by me and considered in my medical decision making (see chart for details).  Patient seen and examined.  Patient symptoms are consistent with musculoskeletal pain.  I agree with treatment prescribed at urgent care  yesterday however patient is obviously worried about coronavirus and other possible etiologies.  Will check rib films with chest x-ray to ensure no pneumonia.  Will send coronavirus testing although patient is likely low risk at this time.  Discussed with patient that testing not return for 1 to 2 days.  Vital signs reviewed and are as follows: BP 131/82 (BP Location: Right Arm)   Pulse 80   Temp 98.6 F (37 C) (Oral)   Resp 16   Ht 6\' 1"  (1.854 m)   Wt 102.1 kg  SpO2 99%   BMI 29.69 kg/m   5:06 PM patient updated on results.  Discussed that he may fill previously provided prescriptions if desired.  Discussed rice protocol, heat and ice, over-the-counter medications as well.  Patient verbalizes understanding agrees with plan.  Encouraged return to the emergency department with worsening respiratory symptoms, trouble breathing, weakness in extremities, uncontrolled pain, or other concerns.     MDM Rules/Calculators/A&P                      Patient with symptoms of musculoskeletal pain.  He has paraspinous tenderness in the mid back muscles as well as some tenderness to the right lateral ribs.  He does have a strenuous job.  Symptoms reproducible with movement and certain positions.  There are no neurologic deficits noted.  I do not suspect ACS, PE, pneumonia.  No fracture or infection or other parenchymal abnormalities noted on imaging.  Encouraged conservative measures at home.   Final Clinical Impression(s) / ED Diagnoses Final diagnoses:  Musculoskeletal pain    Rx / DC Orders ED Discharge Orders    None       Renne CriglerGeiple, Jurney Overacker, PA-C 03/22/19 1708    Terrilee FilesButler, Michael C, MD 03/23/19 1225

## 2019-03-22 NOTE — Discharge Instructions (Signed)
Please read and follow all provided instructions.  Your diagnoses today include:  1. Musculoskeletal pain     Tests performed today include:  Vital signs - see below for your results today  X-ray of the lungs and rib cage do not show any broken bones or other problems  Coronavirus test -pending, results will be back in the next 1 to 2 days  Medications prescribed:   None  Take any prescribed medications only as directed.  Home care instructions:   Follow any educational materials contained in this packet  Please rest, use ice or heat on your back for the next several days  Do not lift, push, pull anything more than 10 pounds for the next week  Follow-up instructions: Please follow-up with your primary care provider in the next 1 week for further evaluation of your symptoms.   Return instructions:  SEEK IMMEDIATE MEDICAL ATTENTION IF YOU HAVE:  Worsening shortness of breath, trouble breathing, increased work of breathing  New numbness, tingling, weakness, or problem with the use of your arms or legs  Severe back pain not relieved with medications  Loss control of your bowels or bladder  Increasing pain in any areas of the body (such as chest or abdominal pain)  Shortness of breath, dizziness, or fainting.   Worsening nausea (feeling sick to your stomach), vomiting, fever, or sweats  Any other emergent concerns regarding your health   Additional Information:  Your vital signs today were: BP 131/82 (BP Location: Right Arm)   Pulse 80   Temp 98.6 F (37 C) (Oral)   Resp 16   Ht 6\' 1"  (1.854 m)   Wt 102.1 kg   SpO2 99%   BMI 29.69 kg/m  If your blood pressure (BP) was elevated above 135/85 this visit, please have this repeated by your doctor within one month. --------------

## 2019-03-22 NOTE — ED Triage Notes (Signed)
Pt presents with c/o left side rib cage and back pain. Pt reports pain present for a couple of days, denies any injury. Pt does report some congestion.

## 2019-03-23 LAB — NOVEL CORONAVIRUS, NAA (HOSP ORDER, SEND-OUT TO REF LAB; TAT 18-24 HRS): SARS-CoV-2, NAA: NOT DETECTED

## 2019-07-05 ENCOUNTER — Encounter (HOSPITAL_COMMUNITY): Payer: Self-pay

## 2019-07-05 ENCOUNTER — Ambulatory Visit (HOSPITAL_COMMUNITY)
Admission: EM | Admit: 2019-07-05 | Discharge: 2019-07-05 | Disposition: A | Discharge status: Home / Self Care | Payer: BC Managed Care – PPO | Attending: Urgent Care | Admitting: Urgent Care

## 2019-07-05 ENCOUNTER — Other Ambulatory Visit: Payer: Self-pay

## 2019-07-05 DIAGNOSIS — Z202 Contact with and (suspected) exposure to infections with a predominantly sexual mode of transmission: Secondary | ICD-10-CM | POA: Diagnosis not present

## 2019-07-05 DIAGNOSIS — R300 Dysuria: Secondary | ICD-10-CM | POA: Diagnosis present

## 2019-07-05 MED ORDER — AZITHROMYCIN 250 MG PO TABS
ORAL_TABLET | ORAL | Status: AC
  Filled 2019-07-05: qty 4

## 2019-07-05 MED ORDER — AZITHROMYCIN 250 MG PO TABS
1000.0000 mg | ORAL_TABLET | Freq: Once | ORAL | Status: AC
  Administered 2019-07-05: 1000 mg via ORAL

## 2019-07-05 NOTE — ED Triage Notes (Signed)
Pt was notified by previous partner she tested positive for Chlamydia two days ago.

## 2019-07-05 NOTE — ED Provider Notes (Signed)
  [facility]   MRN: [id number] DOB: 1990/09/20  Subjective:   Jordan Carr is a 29 y.o. male presenting for 1 to 2-day history of mild dysuria.  Patient actually had exposure to chlamydia and was advised this past week but he did not come in because he did have symptoms.  He would like to have treatment now while he is here in the clinic.  He does not want to take pills as an outpatient.  Denies taking chronic medications.  No Known Allergies  Past Medical History:  Diagnosis Date  . Abscess      History reviewed. No pertinent surgical history.  Family History  Family history unknown: Yes    Social History   Tobacco Use  . Smoking status: Current Every Day Smoker    Types: Cigarettes  . Smokeless tobacco: Current User  Substance Use Topics  . Alcohol use: No  . Drug use: No    ROS   Objective:   Vitals: BP (!) 153/81   Pulse 87   Temp 98.6 F (37 C) (Oral)   Resp 14   SpO2 98%   Physical Exam Constitutional:      General: He is not in acute distress.    Appearance: Normal appearance. He is well-developed and normal weight. He is not ill-appearing, toxic-appearing or diaphoretic.  HENT:     Head: Normocephalic and atraumatic.     Right Ear: External ear normal.     Left Ear: External ear normal.     Nose: Nose normal.     Mouth/Throat:     Pharynx: Oropharynx is clear.  Eyes:     General: No scleral icterus.       Right eye: No discharge.        Left eye: No discharge.     Extraocular Movements: Extraocular movements intact.     Pupils: Pupils are equal, round, and reactive to light.  Cardiovascular:     Rate and Rhythm: Normal rate.  Pulmonary:     Effort: Pulmonary effort is normal.  Musculoskeletal:     Cervical back: Normal range of motion.  Neurological:     Mental Status: He is alert and oriented to person, place, and time.  Psychiatric:        Mood and Affect: Mood normal.        Behavior: Behavior normal.        Thought  Content: Thought content normal.        Judgment: Judgment normal.      Assessment and Plan :   1. Exposure to chlamydia   2. Dysuria     Patient given 1g Azithromycin in clinic. Labs pending. Counseled on abstaining for 1 week following treatment. Counseled patient on potential for adverse effects with medications prescribed/recommended today, ER and return-to-clinic precautions discussed, patient verbalized understanding.    Wallis Bamberg, [place] 07/05/19 1829

## 2019-07-05 NOTE — Discharge Instructions (Addendum)
Avoid all forms of sexual intercourse (oral, vaginal, anal) for the next 7 days to avoid spreading/reinfecting. Return if symptoms worsen/do not resolve, you develop fever, abdominal pain, blood in your urine, or are re-exposed to an STI.  

## 2019-07-06 LAB — CYTOLOGY, (ORAL, ANAL, URETHRAL) ANCILLARY ONLY
Comment: NEGATIVE
Trichomonas: NEGATIVE

## 2019-10-09 ENCOUNTER — Encounter (HOSPITAL_COMMUNITY): Payer: Self-pay

## 2019-10-09 ENCOUNTER — Other Ambulatory Visit: Payer: Self-pay

## 2019-10-09 ENCOUNTER — Ambulatory Visit (HOSPITAL_COMMUNITY)
Admission: EM | Admit: 2019-10-09 | Discharge: 2019-10-09 | Disposition: A | Discharge status: Home / Self Care | Payer: BC Managed Care – PPO | Attending: Emergency Medicine | Admitting: Emergency Medicine

## 2019-10-09 DIAGNOSIS — Z202 Contact with and (suspected) exposure to infections with a predominantly sexual mode of transmission: Secondary | ICD-10-CM | POA: Diagnosis not present

## 2019-10-09 MED ORDER — METRONIDAZOLE 500 MG PO TABS
2000.0000 mg | ORAL_TABLET | Freq: Once | ORAL | Status: AC
  Administered 2019-10-09: 2000 mg via ORAL

## 2019-10-09 MED ORDER — ONDANSETRON 4 MG PO TBDP
ORAL_TABLET | ORAL | Status: AC
  Filled 2019-10-09: qty 1

## 2019-10-09 MED ORDER — ONDANSETRON 4 MG PO TBDP
4.0000 mg | ORAL_TABLET | Freq: Once | ORAL | Status: AC
  Administered 2019-10-09: 4 mg via ORAL

## 2019-10-09 MED ORDER — METRONIDAZOLE 500 MG PO TABS
ORAL_TABLET | ORAL | Status: AC
  Filled 2019-10-09: qty 4

## 2019-10-09 NOTE — Discharge Instructions (Signed)
We have treated you for trichomonas here tonight.  We will notify of you any positive findings or if any changes to treatment are needed. If normal or otherwise without concern to your results, we will not call you. Please log on to your MyChart to review your results if interested in so.   Please withhold from intercourse for the next week. Please use condoms to prevent STD's.

## 2019-10-09 NOTE — ED Triage Notes (Signed)
Pt presents to UC for STD testing. Pt states partner is + trichamonis. Pt denies n/v/d, fever, abdominal pain. Pt denies burning, itching, or painful urination. Pt denies rash.

## 2019-10-10 LAB — CYTOLOGY, (ORAL, ANAL, URETHRAL) ANCILLARY ONLY
Chlamydia: NEGATIVE
Comment: NEGATIVE
Comment: NEGATIVE
Comment: NORMAL
Neisseria Gonorrhea: NEGATIVE
Trichomonas: NEGATIVE

## 2019-10-10 NOTE — ED Provider Notes (Signed)
[facility]    CSN: [id number] Arrival date & time: 10/09/19  1800      History   Chief Complaint Chief Complaint  Patient presents with  . Exposure to STD    HPI Brentt Fread is a 29 y.o. male.   Rmani Theriault presents with complaints of "tingling" with urination which started a few days ago. His male partner just found out she tested positive for trichomonas a few days ago. They do not regularly use condoms. This is his only partner. Denies any penile discharge. Denies any sores, lesions, redness or swelling. Has had chlamydia in the past.     ROS per HPI, negative if not otherwise mentioned.      Past Medical History:  Diagnosis Date  . Abscess     There are no problems to display for this patient.   History reviewed. No pertinent surgical history.     Home Medications    Prior to Admission medications   Medication Sig Start Date End Date Taking? Authorizing Provider  acetaminophen (TYLENOL) 325 MG tablet Take 650 mg by mouth every 6 (six) hours as needed for moderate pain.    [provider]  amoxicillin (AMOXIL) 500 MG tablet Take 500 mg by mouth 3 (three) times daily. 09/07/19   [provider]  fluticasone (FLONASE) 50 MCG/ACT nasal spray Place 2 sprays into both nostrils daily. Patient not taking: Reported on 03/22/2019 03/09/18 03/22/19  Belinda Fisher, PA-C  ipratropium (ATROVENT) 0.06 % nasal spray Place 2 sprays into both nostrils 4 (four) times daily. Patient not taking: Reported on 03/22/2019 03/09/18 03/22/19  Lurline Idol    Family History Family History  Family history unknown: Yes    Social History Social History   Tobacco Use  . Smoking status: Current Every Day Smoker    Types: Cigarettes  . Smokeless tobacco: Current User  Substance Use Topics  . Alcohol use: No  . Drug use: No     Allergies   Patient has no known allergies.   Review of Systems Review of Systems   Physical Exam Triage Vital  Signs ED Triage Vitals  Enc Vitals Group     BP 10/09/19 1806 127/65     Pulse Rate 10/09/19 1806 62     Resp 10/09/19 1806 16     Temp 10/09/19 1806 98.2 F (36.8 C)     Temp Source 10/09/19 1806 Oral     SpO2 10/09/19 1806 98 %     Weight --      Height --      Head Circumference --      Peak Flow --      Pain Score 10/09/19 1807 0     Pain Loc --      Pain Edu? --      Excl. in GC? --    No data found.  Updated Vital Signs BP 127/65 (BP Location: Right Arm)   Pulse 62   Temp 98.2 F (36.8 C) (Oral)   Resp 16   SpO2 98%   Visual Acuity Right Eye Distance:   Left Eye Distance:   Bilateral Distance:    Right Eye Near:   Left Eye Near:    Bilateral Near:     Physical Exam Constitutional:      Appearance: He is well-developed.  Cardiovascular:     Rate and Rhythm: Normal rate and regular rhythm.  Pulmonary:     Effort: Pulmonary effort is normal.  Breath sounds: Normal breath sounds.  Abdominal:     Palpations: Abdomen is soft. Abdomen is not rigid.     Tenderness: There is no abdominal tenderness. There is no guarding or rebound. Negative signs include Murphy's sign and McBurney's sign.     Comments: Denies scrotal redness, swelling, pain; denies sores or lesions; gu exam deferred   Skin:    General: Skin is warm and dry.  Neurological:     Mental Status: He is alert and oriented to person, place, and time.      UC Treatments / Results  Labs (all labs ordered are listed, but only abnormal results are displayed) Labs Reviewed  CYTOLOGY, (ORAL, ANAL, URETHRAL) ANCILLARY ONLY    EKG   Radiology No results found.  Procedures Procedures (including critical care time)  Medications Ordered in UC Medications  metroNIDAZOLE (FLAGYL) tablet 2,000 mg (2,000 mg Oral Given 10/09/19 1832)  ondansetron (ZOFRAN-ODT) disintegrating tablet 4 mg (4 mg Oral Given 10/09/19 1830)    Initial Impression / Assessment and Plan / UC Course  I have reviewed the  triage vital signs and the nursing notes.  Pertinent labs & imaging results that were available during my care of the patient were reviewed by me and considered in my medical decision making (see chart for details).     Empiric coverage with flagyl provided with penile cytology collected and pending. Safe sex encouraged. Patient verbalized understanding and agreeable to plan.   Final Clinical Impressions(s) / UC Diagnoses   Final diagnoses:  Exposure to STD     Discharge Instructions     We have treated you for trichomonas here tonight.  We will notify of you any positive findings or if any changes to treatment are needed. If normal or otherwise without concern to your results, we will not call you. Please log on to your MyChart to review your results if interested in so.   Please withhold from intercourse for the next week. Please use condoms to prevent STD's.     ED Prescriptions    None     PDMP not reviewed this encounter.   Georgetta Haber, NP 10/10/19 1104

## 2020-03-27 ENCOUNTER — Other Ambulatory Visit: Payer: Self-pay

## 2020-03-27 DIAGNOSIS — R519 Headache, unspecified: Secondary | ICD-10-CM | POA: Insufficient documentation

## 2020-03-27 DIAGNOSIS — Z5321 Procedure and treatment not carried out due to patient leaving prior to being seen by health care provider: Secondary | ICD-10-CM | POA: Insufficient documentation

## 2020-03-27 DIAGNOSIS — J329 Chronic sinusitis, unspecified: Secondary | ICD-10-CM | POA: Diagnosis not present

## 2020-03-27 NOTE — ED Triage Notes (Addendum)
Pt reports HA since last week, causing eyes to water, sinus pressure. Pt reports headache went away for one day, then came back today and again caused his eyes to begin watering.   Denies nasal drainage or congestion. Reports trying tylenol, dayquil, sinus medication at various times and has helped briefly but headache returns.

## 2020-03-28 ENCOUNTER — Emergency Department (HOSPITAL_COMMUNITY)
Admission: EM | Admit: 2020-03-28 | Discharge: 2020-03-28 | Disposition: A | Discharge status: Home / Self Care | Payer: BC Managed Care – PPO | Attending: Emergency Medicine | Admitting: Emergency Medicine

## 2020-03-28 NOTE — ED Notes (Signed)
Called 3x. Eloped from waiting area.  °

## 2020-08-03 IMAGING — CR DG RIBS W/ CHEST 3+V*L*
4 series · 4 of 4 positions shown · non-contrast
Comparison: None.

CLINICAL DATA: Back and left rib pain for 4 days.  No known injury.

EXAM:
LEFT RIBS AND CHEST - 3+ VIEW

[w chest pa]
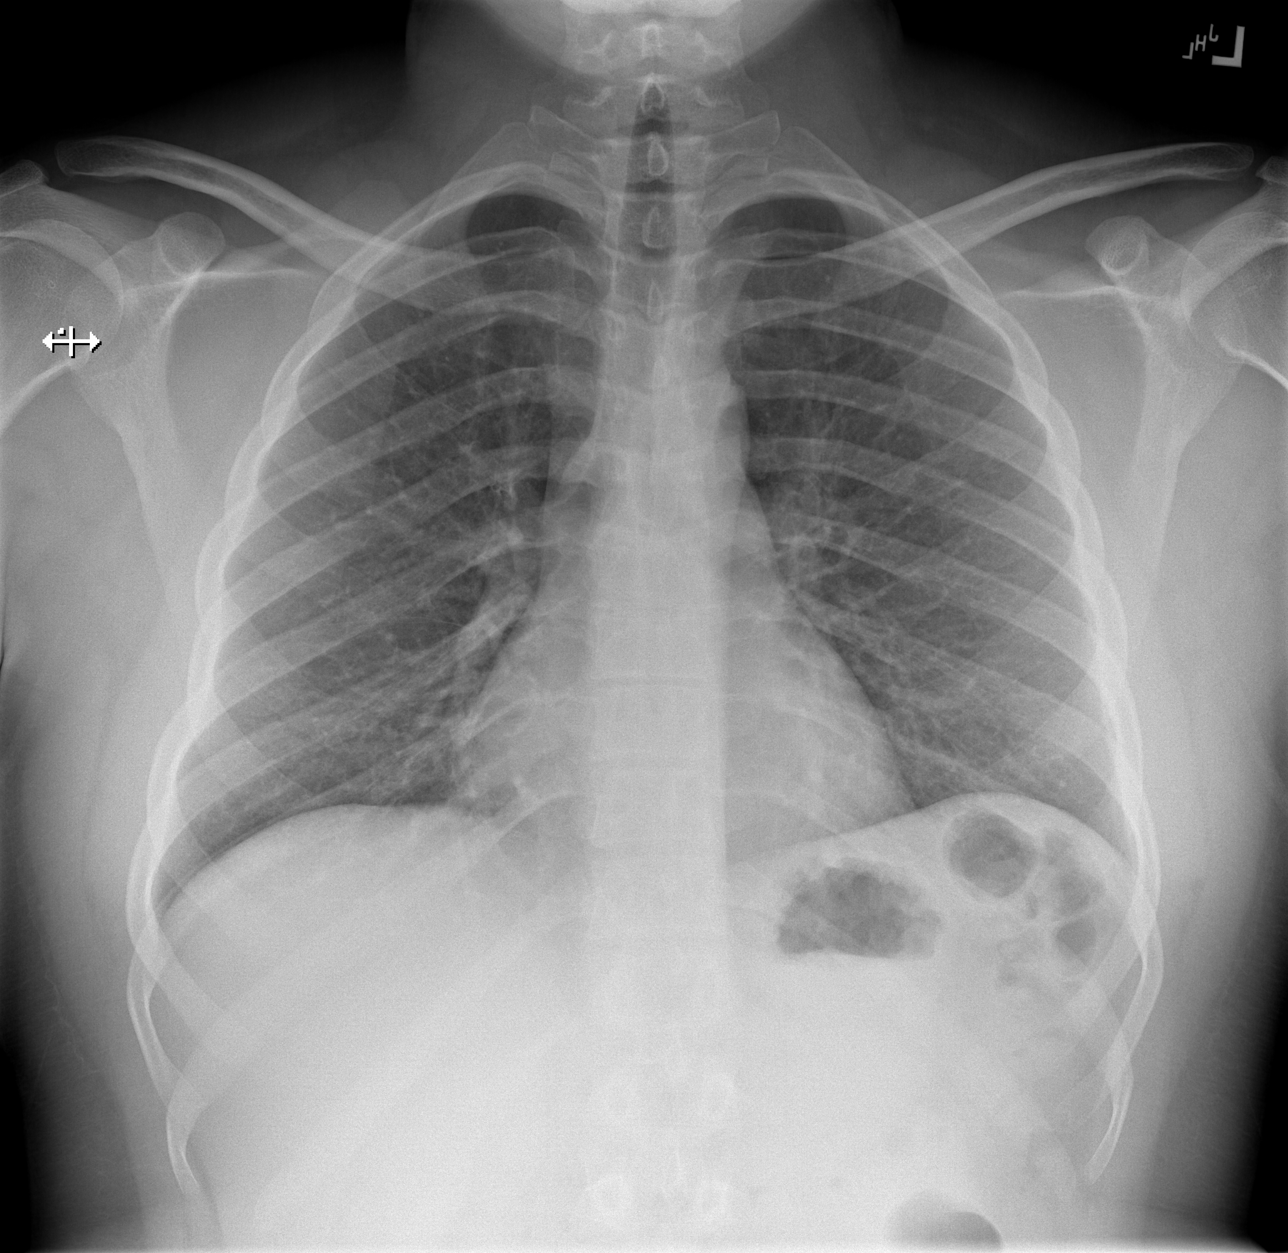

[w ribs ap upper left]
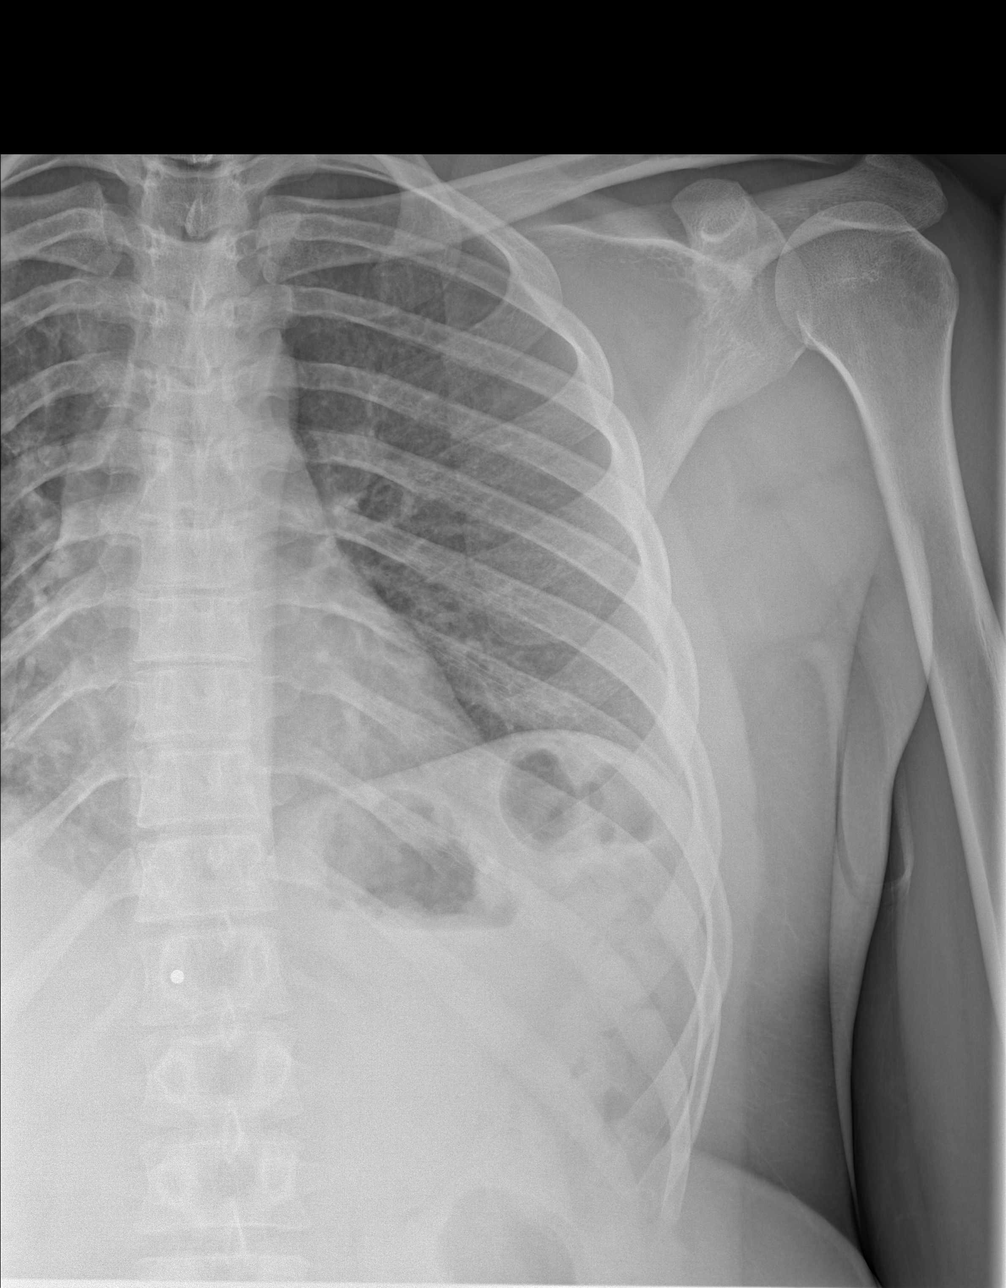

[w ribs ap lower left]
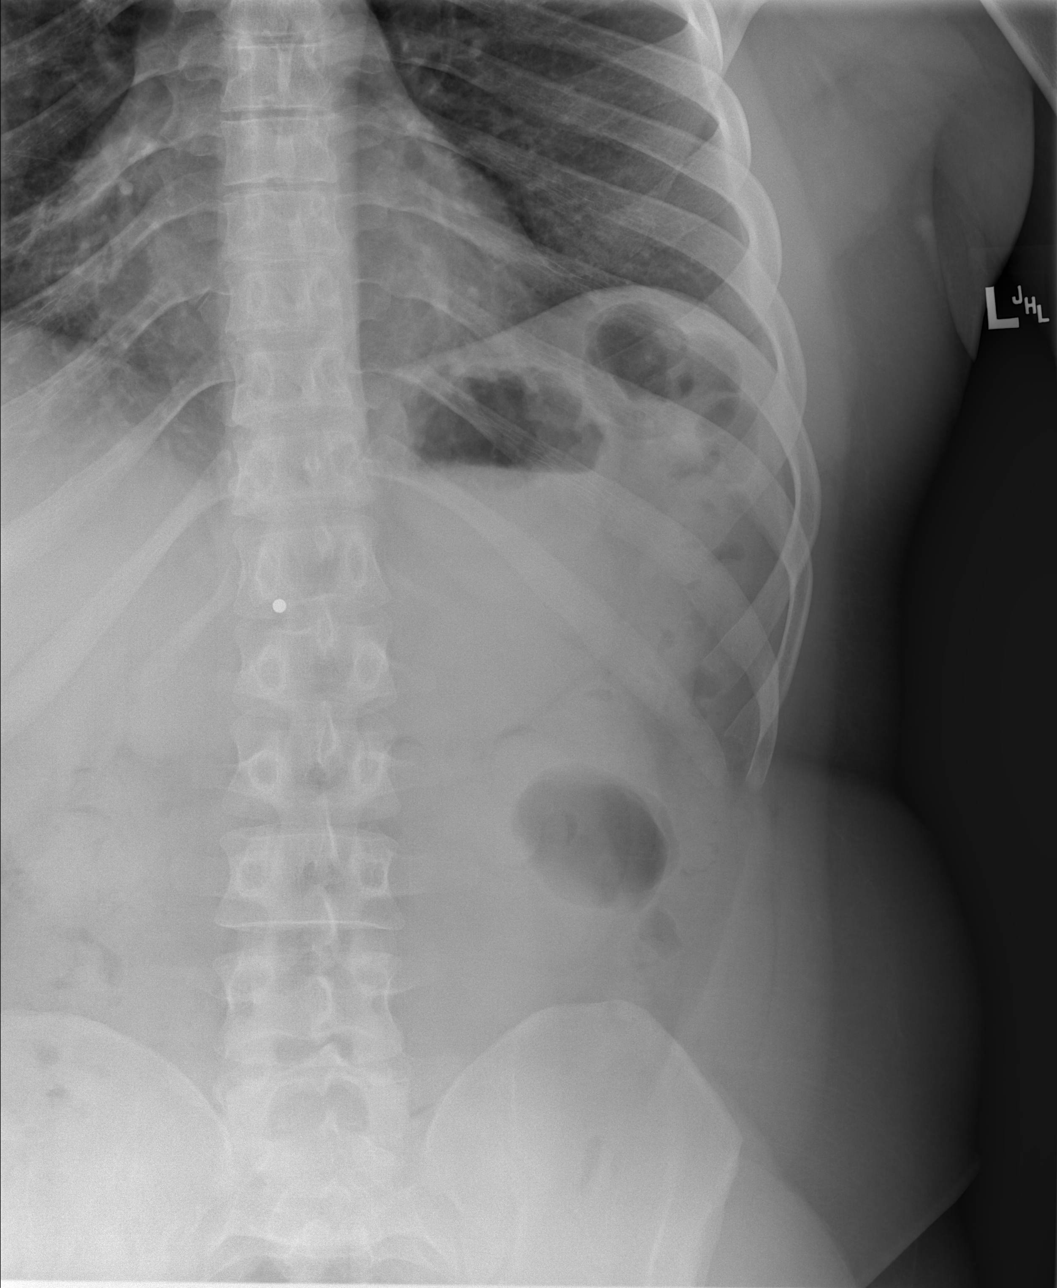

[w ribs obl left]
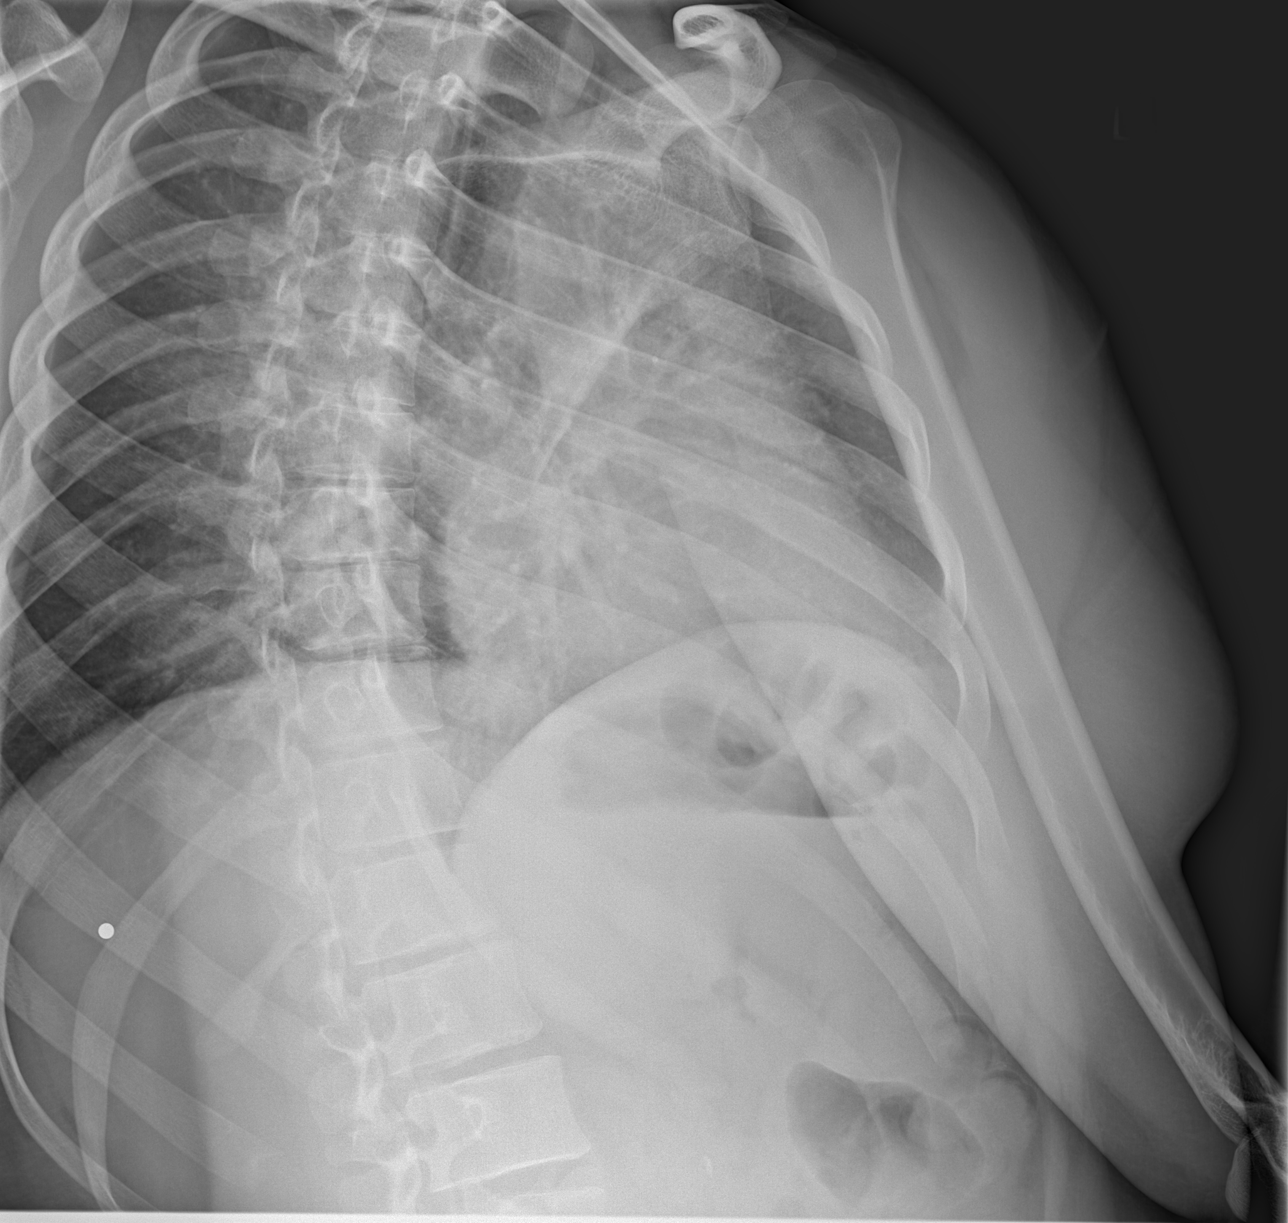

[4 of 4 positions shown; findings below may reference images not displayed]

FINDINGS: Single-view of the chest demonstrates clear lungs and normal heart
size. No pneumothorax or pleural fluid. No fracture or other bony
abnormality is identified.
IMPRESSION: Normal exam.

## 2020-09-06 ENCOUNTER — Ambulatory Visit (HOSPITAL_BASED_OUTPATIENT_CLINIC_OR_DEPARTMENT_OTHER): Payer: BC Managed Care – PPO | Admitting: Family Medicine

## 2020-09-10 ENCOUNTER — Encounter (HOSPITAL_BASED_OUTPATIENT_CLINIC_OR_DEPARTMENT_OTHER): Payer: Self-pay | Admitting: Family Medicine

## 2020-12-22 ENCOUNTER — Other Ambulatory Visit: Payer: Self-pay

## 2020-12-22 ENCOUNTER — Encounter (HOSPITAL_COMMUNITY): Payer: Self-pay | Admitting: *Deleted

## 2020-12-22 ENCOUNTER — Ambulatory Visit (HOSPITAL_COMMUNITY)
Admission: EM | Admit: 2020-12-22 | Discharge: 2020-12-22 | Disposition: A | Discharge status: Home / Self Care | Payer: BC Managed Care – PPO | Attending: Student | Admitting: Student

## 2020-12-22 DIAGNOSIS — R300 Dysuria: Secondary | ICD-10-CM | POA: Insufficient documentation

## 2020-12-22 DIAGNOSIS — Z113 Encounter for screening for infections with a predominantly sexual mode of transmission: Secondary | ICD-10-CM | POA: Insufficient documentation

## 2020-12-22 MED ORDER — CEFTRIAXONE SODIUM 500 MG IJ SOLR
INTRAMUSCULAR | Status: AC
  Filled 2020-12-22: qty 500

## 2020-12-22 MED ORDER — CEFTRIAXONE SODIUM 500 MG IJ SOLR
500.0000 mg | Freq: Once | INTRAMUSCULAR | Status: AC
  Administered 2020-12-22: 500 mg via INTRAMUSCULAR

## 2020-12-22 MED ORDER — LIDOCAINE HCL (PF) 1 % IJ SOLN
INTRAMUSCULAR | Status: AC
  Filled 2020-12-22: qty 30

## 2020-12-22 NOTE — ED Provider Notes (Signed)
[facility]    CSN: [id number] Arrival date & time: 12/22/20  1556      History   Chief Complaint Chief Complaint  Patient presents with   Dysuria    HPI Jordan Carr is a 30 y.o. male presenting with dysuria x1 week following new partner. Medical history noncontributory. Denies hematuria, frequency, urgency, back pain, n/v/d/abd pain, fevers/chills, abdnormal penile discharge, penile rashes/lesions, penile or testicular pain.    HPI  Past Medical History:  Diagnosis Date   Abscess     There are no problems to display for this patient.   History reviewed. No pertinent surgical history.     Home Medications    Prior to Admission medications   Medication Sig Start Date End Date Taking? Authorizing Provider  acetaminophen (TYLENOL) 325 MG tablet Take 650 mg by mouth every 6 (six) hours as needed for moderate pain.    [provider]  fluticasone (FLONASE) 50 MCG/ACT nasal spray Place 2 sprays into both nostrils daily. Patient not taking: Reported on 03/22/2019 03/09/18 03/22/19  Belinda Fisher, PA-C  ipratropium (ATROVENT) 0.06 % nasal spray Place 2 sprays into both nostrils 4 (four) times daily. Patient not taking: Reported on 03/22/2019 03/09/18 03/22/19  Lurline Idol    Family History Family History  Family history unknown: Yes    Social History Social History   Tobacco Use   Smoking status: Every Day    Types: Cigarettes   Smokeless tobacco: Current  Substance Use Topics   Alcohol use: No   Drug use: No     Allergies   Patient has no known allergies.   Review of Systems Review of Systems  Constitutional:  Negative for chills and fever.  HENT:  Negative for sore throat.   Eyes:  Negative for pain and redness.  Respiratory:  Negative for shortness of breath.   Cardiovascular:  Negative for chest pain.  Gastrointestinal:  Negative for abdominal pain, diarrhea, nausea and vomiting.  Genitourinary:  Positive for dysuria. Negative  for decreased urine volume, difficulty urinating, flank pain, frequency, genital sores, hematuria, penile discharge, penile pain, penile swelling, scrotal swelling, testicular pain and urgency.  Musculoskeletal:  Negative for back pain.  Skin:  Negative for rash.  All other systems reviewed and are negative.   Physical Exam Triage Vital Signs ED Triage Vitals  Enc Vitals Group     BP 12/22/20 1610 124/78     Pulse Rate 12/22/20 1610 88     Resp 12/22/20 1610 18     Temp 12/22/20 1610 98.4 F (36.9 C)     Temp src --      SpO2 12/22/20 1610 96 %     Weight --      Height --      Head Circumference --      Peak Flow --      Pain Score 12/22/20 1606 8     Pain Loc --      Pain Edu? --      Excl. in GC? --    No data found.  Updated Vital Signs BP 124/78   Pulse 88   Temp 98.4 F (36.9 C)   Resp 18   SpO2 96%   Visual Acuity Right Eye Distance:   Left Eye Distance:   Bilateral Distance:    Right Eye Near:   Left Eye Near:    Bilateral Near:     Physical Exam Vitals reviewed.  Constitutional:  General: He is not in acute distress.    Appearance: Normal appearance. He is not ill-appearing.  HENT:     Head: Normocephalic and atraumatic.     Mouth/Throat:     Mouth: Mucous membranes are moist.     Comments: Moist mucous membranes Eyes:     Extraocular Movements: Extraocular movements intact.     Pupils: Pupils are equal, round, and reactive to light.  Cardiovascular:     Rate and Rhythm: Normal rate and regular rhythm.     Heart sounds: Normal heart sounds.  Pulmonary:     Effort: Pulmonary effort is normal.     Breath sounds: Normal breath sounds. No wheezing, rhonchi or rales.  Abdominal:     General: Bowel sounds are normal. There is no distension.     Palpations: Abdomen is soft. There is no mass.     Tenderness: There is no abdominal tenderness. There is no right CVA tenderness, left CVA tenderness, guarding or rebound.  Genitourinary:     Comments: deferred Skin:    General: Skin is warm.     Capillary Refill: Capillary refill takes less than 2 seconds.     Comments: Good skin turgor  Neurological:     General: No focal deficit present.     Mental Status: He is alert and oriented to person, place, and time.  Psychiatric:        Mood and Affect: Mood normal.        Behavior: Behavior normal.     UC Treatments / Results  Labs (all labs ordered are listed, but only abnormal results are displayed) Labs Reviewed  CYTOLOGY, (ORAL, ANAL, URETHRAL) ANCILLARY ONLY    EKG   Radiology No results found.  Procedures Procedures (including critical care time)  Medications Ordered in UC Medications  cefTRIAXone (ROCEPHIN) injection 500 mg (has no administration in time range)    Initial Impression / Assessment and Plan / UC Course  I have reviewed the triage vital signs and the nursing notes.  Pertinent labs & imaging results that were available during my care of the patient were reviewed by me and considered in my medical decision making (see chart for details).     This patient is a very pleasant 30 y.o. year old male presenting with dysuria following new partner. Afebrile, nontachycardic, no reproducible abd pain or CVAT.  Will send self-swab for G/C, trich. Declines HIV, RPR. Safe sex precautions. .   Rocephin IM.   ED return precautions discussed. Patient verbalizes understanding and agreement.    Final Clinical Impressions(s) / UC Diagnoses   Final diagnoses:  Dysuria  Routine screening for STI (sexually transmitted infection)     Discharge Instructions      -We have sent testing for sexually transmitted infections. We will notify you of any positive results once they are received. If required, we will prescribe any medications you might need in 2-3 days. Please refrain from all sexual activity until treatment is complete.  -Seek additional medical attention if you develop fevers/chills,  new/worsening abdominal pain, new/worsening penile discomfort/discharge, etc.     ED Prescriptions   None    PDMP not reviewed this encounter.   Rhys Martini, PA-C 12/22/20 1622

## 2020-12-22 NOTE — Discharge Instructions (Addendum)
-  We have sent testing for sexually transmitted infections. We will notify you of any positive results once they are received. If required, we will prescribe any medications you might need in 2-3 days. Please refrain from all sexual activity until treatment is complete.  -Seek additional medical attention if you develop fevers/chills, new/worsening abdominal pain, new/worsening penile discomfort/discharge, etc.

## 2020-12-22 NOTE — ED Triage Notes (Signed)
Pt reports dysuria for one week . 

## 2020-12-24 LAB — CYTOLOGY, (ORAL, ANAL, URETHRAL) ANCILLARY ONLY
Chlamydia: NEGATIVE
Comment: NEGATIVE
Comment: NEGATIVE
Comment: NORMAL
Neisseria Gonorrhea: NEGATIVE
Trichomonas: POSITIVE — AB

## 2020-12-25 ENCOUNTER — Telehealth (HOSPITAL_COMMUNITY): Payer: Self-pay | Admitting: Emergency Medicine

## 2020-12-25 MED ORDER — METRONIDAZOLE 500 MG PO TABS: 2000.0000 mg | ORAL_TABLET | Freq: Once | ORAL | 0 refills | Status: AC

## 2021-01-03 ENCOUNTER — Encounter (HOSPITAL_COMMUNITY): Payer: Self-pay | Admitting: Emergency Medicine

## 2021-01-03 ENCOUNTER — Ambulatory Visit (HOSPITAL_COMMUNITY)
Admission: EM | Admit: 2021-01-03 | Discharge: 2021-01-03 | Disposition: A | Discharge status: Home / Self Care | Payer: BC Managed Care – PPO | Attending: Emergency Medicine | Admitting: Emergency Medicine

## 2021-01-03 ENCOUNTER — Other Ambulatory Visit: Payer: Self-pay

## 2021-01-03 DIAGNOSIS — R300 Dysuria: Secondary | ICD-10-CM

## 2021-01-03 DIAGNOSIS — A599 Trichomoniasis, unspecified: Secondary | ICD-10-CM | POA: Diagnosis not present

## 2021-01-03 MED ORDER — ONDANSETRON HCL 4 MG PO TABS: 4.0000 mg | ORAL_TABLET | Freq: Four times a day (QID) | ORAL | 0 refills | Status: DC

## 2021-01-03 MED ORDER — METRONIDAZOLE 500 MG PO TABS: 2000.0000 mg | ORAL_TABLET | Freq: Once | ORAL | 0 refills | Status: AC

## 2021-01-03 MED ORDER — METRONIDAZOLE 500 MG PO TABS: 2000.0000 mg | ORAL_TABLET | Freq: Once | ORAL | 0 refills | Status: DC

## 2021-01-03 NOTE — Discharge Instructions (Addendum)
Take zofran then wait 30 to 1 hour before take medication  You may crush the metronidazole then place in small amount of applesauce or pudding  If needed may take another dose after 6 hours to ensure medication stays down  Do not have sex for at least 1 weeks and until symptoms resolve

## 2021-01-03 NOTE — ED Triage Notes (Signed)
Pt is present today with dysuria. Pt states that he tried to take the antibiotic and could not keep it down.

## 2021-01-07 NOTE — ED Provider Notes (Signed)
[facility]    CSN: [id number] Arrival date & time: 01/03/21  1753      History   Chief Complaint Chief Complaint  Patient presents with   Medication Refill   Follow-up   Dysuria    HPI Jordan Carr is a 30 y.o. male.   Patient presents with dysuria 2.5 weeks. Denies urgency, frequency, hematuria, abdominal pain, back pain, penile discharge, penile or testicle swelling, new rash or lesions. Seen in UC on 9/24, STI screen positive for trichomoniasis. Requesting refill for treatment of trichomoniasis after vomiting medications. Denies sexual activity since symptoms begin. No pertinent medical history.   Medication Refill Dysuria Presenting symptoms: dysuria   Presenting symptoms: no penile discharge and no penile pain   Associated symptoms: no flank pain, no hematuria, no penile swelling, no scrotal swelling and no urinary frequency   Medication Refill Dysuria Presenting symptoms: dysuria   Presenting symptoms: no penile discharge and no penile pain   Associated symptoms: no flank pain, no hematuria, no penile swelling and no urinary frequency   Medication Refill Dysuria Presenting symptoms: dysuria   Presenting symptoms: no penile discharge and no penile pain   Associated symptoms: no flank pain, no hematuria and no urinary frequency   Medication Refill Dysuria Presenting symptoms: dysuria   Presenting symptoms: no penile discharge   Associated symptoms: no flank pain, no hematuria and no urinary frequency   Medication Refill Dysuria Presenting symptoms: dysuria   Associated symptoms: no flank pain, no hematuria and no urinary frequency   Medication Refill Dysuria Presenting symptoms: dysuria   Associated symptoms: no flank pain and no urinary frequency   Medication Refill Dysuria Presenting symptoms: dysuria   Associated symptoms: no flank pain    Past Medical History:  Diagnosis Date   Abscess     There are no problems to display for this  patient.   History reviewed. No pertinent surgical history.     Home Medications    Prior to Admission medications   Medication Sig Start Date End Date Taking? Authorizing Provider  acetaminophen (TYLENOL) 325 MG tablet Take 650 mg by mouth every 6 (six) hours as needed for moderate pain.    [provider]  ondansetron (ZOFRAN) 4 MG tablet Take 1 tablet (4 mg total) by mouth every 6 (six) hours. 01/03/21   Fiora Weill, Elita Boone, NP  fluticasone (FLONASE) 50 MCG/ACT nasal spray Place 2 sprays into both nostrils daily. Patient not taking: Reported on 03/22/2019 03/09/18 03/22/19  Belinda Fisher, PA-C  ipratropium (ATROVENT) 0.06 % nasal spray Place 2 sprays into both nostrils 4 (four) times daily. Patient not taking: Reported on 03/22/2019 03/09/18 03/22/19  Lurline Idol    Family History Family History  Family history unknown: Yes    Social History Social History   Tobacco Use   Smoking status: Every Day    Types: Cigarettes   Smokeless tobacco: Current  Substance Use Topics   Alcohol use: No   Drug use: No     Allergies   Patient has no known allergies.   Review of Systems Review of Systems  Respiratory: Negative.    Cardiovascular: Negative.   Genitourinary:  Positive for dysuria. Negative for decreased urine volume, difficulty urinating, enuresis, flank pain, frequency, genital sores, hematuria, penile discharge, penile pain, penile swelling, scrotal swelling, testicular pain and urgency.  Skin: Negative.     Physical Exam Triage Vital Signs ED Triage Vitals  Enc Vitals Group     BP 01/03/21  1829 (!) 142/76     Pulse Rate 01/03/21 1828 61     Resp 01/03/21 1828 17     Temp 01/03/21 1828 98.1 F (36.7 C)     Temp src --      SpO2 01/03/21 1828 100 %     Weight --      Height --      Head Circumference --      Peak Flow --      Pain Score 01/03/21 1828 5     Pain Loc --      Pain Edu? --      Excl. in GC? --    No data found.  Updated Vital  Signs BP (!) 142/76   Pulse 61   Temp 98.1 F (36.7 C)   Resp 17   SpO2 100%   Visual Acuity Right Eye Distance:   Left Eye Distance:   Bilateral Distance:    Right Eye Near:   Left Eye Near:    Bilateral Near:     Physical Exam Constitutional:      Appearance: Normal appearance. He is normal weight.  Eyes:     Extraocular Movements: Extraocular movements intact.  Pulmonary:     Effort: Pulmonary effort is normal.  Genitourinary:    Comments: deferred Skin:    General: Skin is warm and dry.  Neurological:     Mental Status: He is alert and oriented to person, place, and time. Mental status is at baseline.  Psychiatric:        Mood and Affect: Mood normal.        Behavior: Behavior normal.     UC Treatments / Results  Labs (all labs ordered are listed, but only abnormal results are displayed) Labs Reviewed - No data to display  EKG   Radiology No results found.  Procedures Procedures (including critical care time)  Medications Ordered in UC Medications - No data to display  Initial Impression / Assessment and Plan / UC Course  I have reviewed the triage vital signs and the nursing notes.  Pertinent labs & imaging results that were available during my care of the patient were reviewed by me and considered in my medical decision making (see chart for details).  Dysuria Trichomoniasis  Metronidazole 2 g once Zofran 4 mg every 6 hours prn UC follow as needed  Final Clinical Impressions(s) / UC Diagnoses   Final diagnoses:  Dysuria  Trichomoniasis     Discharge Instructions      Take zofran then wait 30 to 1 hour before take medication  You may crush the metronidazole then place in small amount of applesauce or pudding  If needed may take another dose after 6 hours to ensure medication stays down  Do not have sex for at least 1 weeks and until symptoms resolve    ED Prescriptions     Medication Sig Dispense Auth. Provider    metroNIDAZOLE (FLAGYL) 500 MG tablet  (Status: Discontinued) Take 4 tablets (2,000 mg total) by mouth once for 1 dose. 4 tablet Thaddeus Evitts R, NP   ondansetron (ZOFRAN) 4 MG tablet  (Status: Discontinued) Take 1 tablet (4 mg total) by mouth every 6 (six) hours. 3 tablet Deija Buhrman R, NP   metroNIDAZOLE (FLAGYL) 500 MG tablet Take 4 tablets (2,000 mg total) by mouth once for 1 dose. 4 tablet Heaton Sarin R, NP   ondansetron (ZOFRAN) 4 MG tablet Take 1 tablet (4 mg total) by mouth every 6 (  six) hours. 3 tablet Valinda Hoar, NP      PDMP not reviewed this encounter.   Valinda Hoar, NP 01/07/21 1422

## 2021-01-26 ENCOUNTER — Other Ambulatory Visit: Payer: Self-pay

## 2021-01-26 ENCOUNTER — Ambulatory Visit (HOSPITAL_COMMUNITY)
Admission: EM | Admit: 2021-01-26 | Discharge: 2021-01-26 | Disposition: A | Discharge status: Home / Self Care | Payer: BC Managed Care – PPO | Attending: Emergency Medicine | Admitting: Emergency Medicine

## 2021-01-26 ENCOUNTER — Encounter (HOSPITAL_COMMUNITY): Payer: Self-pay | Admitting: Emergency Medicine

## 2021-01-26 DIAGNOSIS — R300 Dysuria: Secondary | ICD-10-CM | POA: Diagnosis not present

## 2021-01-26 DIAGNOSIS — Z8619 Personal history of other infectious and parasitic diseases: Secondary | ICD-10-CM | POA: Insufficient documentation

## 2021-01-26 DIAGNOSIS — Z113 Encounter for screening for infections with a predominantly sexual mode of transmission: Secondary | ICD-10-CM | POA: Insufficient documentation

## 2021-01-26 DIAGNOSIS — Z7251 High risk heterosexual behavior: Secondary | ICD-10-CM | POA: Diagnosis not present

## 2021-01-26 LAB — POCT URINALYSIS DIPSTICK, ED / UC
Bilirubin Urine: NEGATIVE
Glucose, UA: NEGATIVE mg/dL
Ketones, ur: NEGATIVE mg/dL
Leukocytes,Ua: NEGATIVE
Nitrite: NEGATIVE
Protein, ur: NEGATIVE mg/dL
Specific Gravity, Urine: >=1.03 (ref 1.005–1.030)
Urobilinogen, UA: 0.2 mg/dL (ref 0.0–1.0)
pH: 6 (ref 5.0–8.0)

## 2021-01-26 NOTE — Discharge Instructions (Addendum)
Screening for sexually transmitted disease will be performed today, we will also perform a urinalysis to rule out urinary tract infection with other nonsexually transmitted bacteria.  Please remember that the best way to prevent sexual transmitted diseases is to use condoms every time.  Frequent sexually transmitted disease infections can cause permanent damage and possibly interfere with your ability to conceive children.

## 2021-01-26 NOTE — ED Triage Notes (Signed)
Pt reports dysuria for couple days. Denies discharge, or drainage.

## 2021-01-26 NOTE — ED Provider Notes (Signed)
[facility]    CSN: [id number] Arrival date & time: 01/26/21  1241      History   Chief Complaint Chief Complaint  Patient presents with   Dysuria    HPI Jordan Carr is a 30 y.o. male.   Pt reports dysuria for couple days. Denies penile discharge, scrotal swelling, scrotal pain, inguinal swelling, pelvic pain, pelvic pressure, increased frequency of urination, hematuria.  Patient states he currently is involved with 2 sexual partners, does not use condoms, reports history of trichomonas in the past.  Patient denies fever, aches, chills, nausea, vomiting, diarrhea, muscle pain, rash, genital lesions.  The history is provided by the patient.   Past Medical History:  Diagnosis Date   Abscess     There are no problems to display for this patient.   History reviewed. No pertinent surgical history.    Home Medications    Prior to Admission medications   Medication Sig Start Date End Date Taking? Authorizing Provider  acetaminophen (TYLENOL) 325 MG tablet Take 650 mg by mouth every 6 (six) hours as needed for moderate pain.    [provider]  ondansetron (ZOFRAN) 4 MG tablet Take 1 tablet (4 mg total) by mouth every 6 (six) hours. 01/03/21   White, Elita Boone, NP  fluticasone (FLONASE) 50 MCG/ACT nasal spray Place 2 sprays into both nostrils daily. Patient not taking: Reported on 03/22/2019 03/09/18 03/22/19  Belinda Fisher, PA-C  ipratropium (ATROVENT) 0.06 % nasal spray Place 2 sprays into both nostrils 4 (four) times daily. Patient not taking: Reported on 03/22/2019 03/09/18 03/22/19  Lurline Idol    Family History Family History  Family history unknown: Yes    Social History Social History   Tobacco Use   Smoking status: Every Day    Types: Cigarettes   Smokeless tobacco: Current  Substance Use Topics   Alcohol use: No   Drug use: No     Allergies   Patient has no known allergies.   Review of Systems Review of Systems Pertinent  findings noted in history of present illness.    Physical Exam Triage Vital Signs ED Triage Vitals  Enc Vitals Group     BP 01/25/21 0827 (!) 147/82     Pulse Rate 01/25/21 0827 72     Resp 01/25/21 0827 18     Temp 01/25/21 0827 98.3 F (36.8 C)     Temp Source 01/25/21 0827 Oral     SpO2 01/25/21 0827 98 %     Weight --      Height --      Head Circumference --      Peak Flow --      Pain Score 01/25/21 0826 5     Pain Loc --      Pain Edu? --      Excl. in GC? --    No data found.  Updated Vital Signs BP 119/83 (BP Location: Right Arm)   Pulse 88   Temp 99.3 F (37.4 C) (Oral)   Resp 16   SpO2 97%   Visual Acuity Right Eye Distance:   Left Eye Distance:   Bilateral Distance:    Right Eye Near:   Left Eye Near:    Bilateral Near:     Physical Exam Vitals and nursing note reviewed.  Constitutional:      General: He is not in acute distress.    Appearance: Normal appearance. He is not ill-appearing.  HENT:  Head: Normocephalic and atraumatic.  Eyes:     General: Lids are normal.        Right eye: No discharge.        Left eye: No discharge.     Extraocular Movements: Extraocular movements intact.     Conjunctiva/sclera: Conjunctivae normal.     Right eye: Right conjunctiva is not injected.     Left eye: Left conjunctiva is not injected.  Neck:     Trachea: Trachea and phonation normal.  Cardiovascular:     Rate and Rhythm: Normal rate and regular rhythm.     Pulses: Normal pulses.     Heart sounds: Normal heart sounds. No murmur heard.   No friction rub. No gallop.  Pulmonary:     Effort: Pulmonary effort is normal. No accessory muscle usage, prolonged expiration or respiratory distress.     Breath sounds: Normal breath sounds. No stridor, decreased air movement or transmitted upper airway sounds. No decreased breath sounds, wheezing, rhonchi or rales.  Chest:     Chest wall: No tenderness.  Genitourinary:    Comments: Pt politely declines GU  exam, pt did provide a swab for testing.   Musculoskeletal:        General: Normal range of motion.     Cervical back: Normal range of motion and neck supple. Normal range of motion.  Lymphadenopathy:     Cervical: No cervical adenopathy.  Skin:    General: Skin is warm and dry.     Findings: No erythema or rash.  Neurological:     General: No focal deficit present.     Mental Status: He is alert and oriented to person, place, and time.  Psychiatric:        Mood and Affect: Mood normal.        Behavior: Behavior normal.     UC Treatments / Results  Labs (all labs ordered are listed, but only abnormal results are displayed) Labs Reviewed  URINE CULTURE  POCT URINALYSIS DIPSTICK, ED / UC  CYTOLOGY, (ORAL, ANAL, URETHRAL) ANCILLARY ONLY    EKG   Radiology No results found.  Procedures Procedures (including critical care time)  Medications Ordered in UC Medications - No data to display  Initial Impression / Assessment and Plan / UC Course  I have reviewed the triage vital signs and the nursing notes.  Pertinent labs & imaging results that were available during my care of the patient were reviewed by me and considered in my medical decision making (see chart for details).     Urinalysis, urine culture and cytology performed today.  Patient advised we will notify him of the results once received and treatment will be provided if needed.  Discussed sex practices including using condoms.  Patient verbalized understanding and agreement of plan as discussed.  All questions were addressed during visit.  Please see discharge instructions below for further details of plan.  Final Clinical Impressions(s) / UC Diagnoses   Final diagnoses:  Screening examination for STD (sexually transmitted disease)  Dysuria  History of sexually transmitted disease  High risk heterosexual behavior     Discharge Instructions      Screening for sexually transmitted disease will be  performed today, we will also perform a urinalysis to rule out urinary tract infection with other nonsexually transmitted bacteria.  Please remember that the best way to prevent sexual transmitted diseases is to use condoms every time.  Frequent sexually transmitted disease infections can cause permanent damage and possibly interfere  with your ability to conceive children.     ED Prescriptions   None    PDMP not reviewed this encounter.    Theadora Rama Scales, PA-C 01/26/21 1437

## 2021-01-27 LAB — URINE CULTURE: Culture: NO GROWTH

## 2021-01-28 LAB — CYTOLOGY, (ORAL, ANAL, URETHRAL) ANCILLARY ONLY
Chlamydia: NEGATIVE
Comment: NEGATIVE
Comment: NEGATIVE
Comment: NORMAL
Neisseria Gonorrhea: NEGATIVE
Trichomonas: NEGATIVE

## 2021-07-05 ENCOUNTER — Encounter (HOSPITAL_COMMUNITY): Payer: Self-pay | Admitting: Emergency Medicine

## 2021-07-05 ENCOUNTER — Emergency Department (HOSPITAL_COMMUNITY)
Admission: EM | Admit: 2021-07-05 | Discharge: 2021-07-05 | Disposition: A | Discharge status: Home / Self Care | Payer: BC Managed Care – PPO | Attending: Emergency Medicine | Admitting: Emergency Medicine

## 2021-07-05 DIAGNOSIS — R519 Headache, unspecified: Secondary | ICD-10-CM | POA: Insufficient documentation

## 2021-07-05 DIAGNOSIS — H53142 Visual discomfort, left eye: Secondary | ICD-10-CM | POA: Insufficient documentation

## 2021-07-05 DIAGNOSIS — H9202 Otalgia, left ear: Secondary | ICD-10-CM | POA: Insufficient documentation

## 2021-07-05 MED ORDER — PROCHLORPERAZINE EDISYLATE 10 MG/2ML IJ SOLN
10.0000 mg | Freq: Once | INTRAMUSCULAR | Status: AC
  Administered 2021-07-05: 10 mg via INTRAMUSCULAR
  Filled 2021-07-05: qty 2

## 2021-07-05 MED ORDER — DEXAMETHASONE 4 MG PO TABS
10.0000 mg | ORAL_TABLET | Freq: Once | ORAL | Status: AC
  Administered 2021-07-05: 10 mg via ORAL
  Filled 2021-07-05: qty 3

## 2021-07-05 MED ORDER — AMOXICILLIN-POT CLAVULANATE 875-125 MG PO TABS
1.0000 | ORAL_TABLET | Freq: Once | ORAL | Status: AC
  Administered 2021-07-05: 1 via ORAL
  Filled 2021-07-05: qty 1

## 2021-07-05 MED ORDER — DIPHENHYDRAMINE HCL 50 MG/ML IJ SOLN
25.0000 mg | Freq: Once | INTRAMUSCULAR | Status: AC
  Administered 2021-07-05: 25 mg via INTRAMUSCULAR
  Filled 2021-07-05: qty 1

## 2021-07-05 MED ORDER — AMOXICILLIN-POT CLAVULANATE 875-125 MG PO TABS: 1.0000 | ORAL_TABLET | Freq: Two times a day (BID) | ORAL | 0 refills | Status: DC

## 2021-07-05 NOTE — Discharge Instructions (Signed)
Take 4 over the counter ibuprofen tablets 3 times a day or 2 over-the-counter naproxen tablets twice a day for pain. ?Also take tylenol 1000mg (2 extra strength) four times a day.  ? ?There is unfortunately a phenomenon where if you take medicine to try and prevent a headache it can create one.  Only take medicine if you are having a headache. ? ?

## 2021-07-05 NOTE — ED Notes (Signed)
Pt verbalizes understanding of discharge instructions. Opportunity for questions and answers were provided. Pt discharged from the ED.   ?

## 2021-07-05 NOTE — ED Provider Notes (Signed)
?[facility] EMERGENCY DEPARTMENT ?Provider Note ? ? ?CSN: [id number] ?Arrival date & time: 07/05/21  0716 ? ?  ? ?History ? ?Chief Complaint  ?Patient presents with  ? Headache  ? ? ?Jordan Carr is a 31 y.o. male. ? ?31 yo M with a chief complaints of a headache.  Going on for a couple weeks.  Left-sided across the forehead causing some photophobia.  Has had headaches like this before but not this severe and not lasting this long.  He denies fever denies cough or congestion denies trauma denies recent chiropractor visit denies one-sided numbness or weakness denies difficulty speech or swallowing. ? ? ?Headache ? ?  ? ?Home Medications ?Prior to Admission medications   ?Medication Sig Start Date End Date Taking? Authorizing Provider  ?amoxicillin-clavulanate (AUGMENTIN) 875-125 MG tablet Take 1 tablet by mouth every 12 (twelve) hours. 07/05/21  Yes Melene Plan, DO  ?acetaminophen (TYLENOL) 325 MG tablet Take 650 mg by mouth every 6 (six) hours as needed for moderate pain.    [provider]  ?ondansetron (ZOFRAN) 4 MG tablet Take 1 tablet (4 mg total) by mouth every 6 (six) hours. 01/03/21   Valinda Hoar, NP  ?fluticasone (FLONASE) 50 MCG/ACT nasal spray Place 2 sprays into both nostrils daily. ?Patient not taking: Reported on 03/22/2019 03/09/18 03/22/19  Belinda Fisher, PA-C  ?ipratropium (ATROVENT) 0.06 % nasal spray Place 2 sprays into both nostrils 4 (four) times daily. ?Patient not taking: Reported on 03/22/2019 03/09/18 03/22/19  Belinda Fisher, PA-C  ?   ? ?Allergies    ?Patient has no known allergies.   ? ?Review of Systems   ?Review of Systems  ?Neurological:  Positive for headaches.  ? ?Physical Exam ?Updated Vital Signs ?BP 98/69   Pulse 77   Temp 98.7 ?F (37.1 ?C) (Oral)   Resp 15   SpO2 100%  ?Physical Exam ?Vitals and nursing note reviewed.  ?Constitutional:   ?   Appearance: He is well-developed.  ?HENT:  ?   Head: Normocephalic and atraumatic.  ?   Ears:  ?   Comments: Left TM with  effusion and erythema without obvious distortion of landmarks or bulging ?   Nose: No congestion.  ?Eyes:  ?   Pupils: Pupils are equal, round, and reactive to light.  ?Neck:  ?   Vascular: No JVD.  ?Cardiovascular:  ?   Rate and Rhythm: Normal rate and regular rhythm.  ?   Heart sounds: No murmur heard. ?  No friction rub. No gallop.  ?Pulmonary:  ?   Effort: No respiratory distress.  ?   Breath sounds: No wheezing.  ?Abdominal:  ?   General: There is no distension.  ?   Tenderness: There is no abdominal tenderness. There is no guarding or rebound.  ?Musculoskeletal:     ?   General: Normal range of motion.  ?   Cervical back: Normal range of motion and neck supple.  ?Skin: ?   Coloration: Skin is not pale.  ?   Findings: No rash.  ?Neurological:  ?   Mental Status: He is alert and oriented to person, place, and time.  ?   Cranial Nerves: Cranial nerves 2-12 are intact.  ?   Sensory: Sensation is intact.  ?   Motor: Motor function is intact.  ?   Coordination: Coordination is intact.  ?   Comments: Photophobia, subjective decrease sensation to the left side of the face otherwise benign neurologic exam  ?  Psychiatric:     ?   Behavior: Behavior normal.  ? ? ?ED Results / Procedures / Treatments   ?Labs ?(all labs ordered are listed, but only abnormal results are displayed) ?Labs Reviewed - No data to display ? ?EKG ?None ? ?Radiology ?No results found. ? ?Procedures ?Procedures  ? ? ?Medications Ordered in ED ?Medications  ?prochlorperazine (COMPAZINE) injection 10 mg (has no administration in time range)  ?diphenhydrAMINE (BENADRYL) injection 25 mg (has no administration in time range)  ?amoxicillin-clavulanate (AUGMENTIN) 875-125 MG per tablet 1 tablet (has no administration in time range)  ?dexamethasone (DECADRON) tablet 10 mg (has no administration in time range)  ? ? ?ED Course/ Medical Decision Making/ A&P ?  ?                        ?Medical Decision Making ?Risk ?Prescription drug management. ? ? ?31 yo M  with a chief complaints of a headache.  Going on for couple weeks.  Worse with bright lights and loud noises.  Has had some difficulty sleeping due to pain.  He denies any congestion though clinically has congestion on exam has a serous effusion to the left TM with some erythema.  Benign neurologic exam.  Most likely sinusitis.  We will give a headache cocktail here.  Started on antibiotics.  Given neurology follow-up. ? ?8:36 AM:  I have discussed the diagnosis/risks/treatment options with the patient.  Evaluation and diagnostic testing in the emergency department does not suggest an emergent condition requiring admission or immediate intervention beyond what has been performed at this time.  They will follow up with  PCP, neuro. We also discussed returning to the ED immediately if new or worsening sx occur. We discussed the sx which are most concerning (e.g., sudden worsening pain, fever, inability to tolerate by mouth) that necessitate immediate return. Medications administered to the patient during their visit and any new prescriptions provided to the patient are listed below. ? ?Medications given during this visit ?Medications  ?prochlorperazine (COMPAZINE) injection 10 mg (has no administration in time range)  ?diphenhydrAMINE (BENADRYL) injection 25 mg (has no administration in time range)  ?amoxicillin-clavulanate (AUGMENTIN) 875-125 MG per tablet 1 tablet (has no administration in time range)  ?dexamethasone (DECADRON) tablet 10 mg (has no administration in time range)  ? ? ? ?The patient appears reasonably screen and/or stabilized for discharge and I doubt any other medical condition or other [facility] requiring further screening, evaluation, or treatment in the ED at this time prior to discharge.  ? ? ? ? ? ? ? ? ?Final Clinical Impression(s) / ED Diagnoses ?Final diagnoses:  ?Sinus headache  ? ? ?Rx / DC Orders ?ED Discharge Orders   ? ?      Ordered  ?  Ambulatory referral to Neurology       ?Comments:  Headache syndrome?  ? 07/05/21 0831  ?  amoxicillin-clavulanate (AUGMENTIN) 875-125 MG tablet  Every 12 hours       ? 07/05/21 0831  ? ?  ?  ? ?  ? ? ?  ?Melene Plan, DO ?07/05/21 [phone] ? ?

## 2021-07-05 NOTE — ED Triage Notes (Signed)
Patient complains of left sided headache and eye pain that started approximately one week ago. Patient states he has taken tylenol at home with temporary relief. Patient reports remote history of migraines but is not prescribed preventative nor rescue medications for migraines. Pain is exacerbated by laying down on left side. Patient is alert, oriented, ambulatory, and in no apparent distress at this time. ?

## 2021-08-12 ENCOUNTER — Inpatient Hospital Stay: Payer: Self-pay | Admitting: Psychiatry

## 2021-08-12 ENCOUNTER — Encounter: Payer: Self-pay | Admitting: Psychiatry
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
## 2022-03-13 ENCOUNTER — Emergency Department (HOSPITAL_COMMUNITY)
Admission: EM | Admit: 2022-03-13 | Discharge: 2022-03-14 | Discharge status: Against Medical Advice | Payer: Self-pay | Attending: Emergency Medicine | Admitting: Emergency Medicine

## 2022-03-13 ENCOUNTER — Emergency Department (HOSPITAL_COMMUNITY): Payer: Self-pay

## 2022-03-13 ENCOUNTER — Other Ambulatory Visit: Payer: Self-pay

## 2022-03-13 DIAGNOSIS — M549 Dorsalgia, unspecified: Secondary | ICD-10-CM | POA: Insufficient documentation

## 2022-03-13 DIAGNOSIS — Z5321 Procedure and treatment not carried out due to patient leaving prior to being seen by health care provider: Secondary | ICD-10-CM | POA: Insufficient documentation

## 2022-03-13 DIAGNOSIS — Y92410 Unspecified street and highway as the place of occurrence of the external cause: Secondary | ICD-10-CM | POA: Insufficient documentation

## 2022-03-13 MED ORDER — NAPROXEN 250 MG PO TABS
500.0000 mg | ORAL_TABLET | Freq: Once | ORAL | Status: AC
  Administered 2022-03-13: 500 mg via ORAL
  Filled 2022-03-13: qty 2
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
## 2022-03-14 NOTE — ED Notes (Signed)
Pt called multiple times for vitals recheck, no response. Moving pt OTF.

## 2023-09-10 ENCOUNTER — Ambulatory Visit (HOSPITAL_COMMUNITY)
Admission: EM | Admit: 2023-09-10 | Discharge: 2023-09-10 | Disposition: A | Discharge status: Home / Self Care | Payer: Self-pay | Attending: Family Medicine | Admitting: Family Medicine

## 2023-09-10 ENCOUNTER — Encounter (HOSPITAL_COMMUNITY): Payer: Self-pay

## 2023-09-10 DIAGNOSIS — N342 Other urethritis: Secondary | ICD-10-CM | POA: Insufficient documentation

## 2023-09-10 DIAGNOSIS — Z202 Contact with and (suspected) exposure to infections with a predominantly sexual mode of transmission: Secondary | ICD-10-CM | POA: Insufficient documentation

## 2023-09-10 LAB — HIV ANTIBODY (ROUTINE TESTING W REFLEX): HIV Screen 4th Generation wRfx: NONREACTIVE

## 2023-09-10 MED ORDER — METRONIDAZOLE 500 MG PO TABS: 2000.0000 mg | ORAL_TABLET | Freq: Once | ORAL | 0 refills | Status: AC

## 2023-09-10 NOTE — ED Provider Notes (Signed)
 [facility]    CSN: [id number] Arrival date & time: 09/10/23  0831      History   Chief Complaint Chief Complaint  Patient presents with   Exposure to STD    HPI Jordan Carr is a 33 y.o. male.    Exposure to STD  Here for exposure to trichomonas, as a sexual partner has tested positive for it.  He does have a little bit of urinary burning.  No discharge or itching.  NKDA    Past Medical History:  Diagnosis Date   Abscess     There are no active problems to display for this patient.   History reviewed. No pertinent surgical history.     Home Medications    Prior to Admission medications   Medication Sig Start Date End Date Taking? Authorizing Provider  metroNIDAZOLE  (FLAGYL ) 500 MG tablet Take 4 tablets (2,000 mg total) by mouth once for 1 dose. 09/10/23 09/10/23 Yes BanisterPaige Boatman, MD  fluticasone  (FLONASE ) 50 MCG/ACT nasal spray Place 2 sprays into both nostrils daily. Patient not taking: Reported on 03/22/2019 03/09/18 03/22/19  Alfrieda Antes, PA-C  ipratropium (ATROVENT ) 0.06 % nasal spray Place 2 sprays into both nostrils 4 (four) times daily. Patient not taking: Reported on 03/22/2019 03/09/18 03/22/19  Lasandra Points    Family History Family History  Family history unknown: Yes    Social History Social History   Tobacco Use   Smoking status: Every Day    Types: Cigarettes   Smokeless tobacco: Current  Substance Use Topics   Alcohol use: No   Drug use: No     Allergies   Patient has no known allergies.   Review of Systems Review of Systems   Physical Exam Triage Vital Signs ED Triage Vitals [09/10/23 0850]  Encounter Vitals Group     BP 134/82     Girls Systolic BP Percentile      Girls Diastolic BP Percentile      Boys Systolic BP Percentile      Boys Diastolic BP Percentile      Pulse Rate 75     Resp 16     Temp 98.5 F (36.9 C)     Temp Source Oral     SpO2 97 %     Weight 235 lb (106.6 kg)     Height 6'  (1.829 m)     Head Circumference      Peak Flow      Pain Score 0     Pain Loc      Pain Education      Exclude from Growth Chart    No data found.  Updated Vital Signs BP 134/82 (BP Location: Left Arm)   Pulse 75   Temp 98.5 F (36.9 C) (Oral)   Resp 16   Ht 6' (1.829 m)   Wt 106.6 kg   SpO2 97%   BMI 31.87 kg/m   Visual Acuity Right Eye Distance:   Left Eye Distance:   Bilateral Distance:    Right Eye Near:   Left Eye Near:    Bilateral Near:     Physical Exam Vitals reviewed.  Constitutional:      General: He is not in acute distress.    Appearance: He is not toxic-appearing.   Skin:    Coloration: Skin is not pale.   Neurological:     Mental Status: He is alert and oriented to person, place, and time.   Psychiatric:  Behavior: Behavior normal.      UC Treatments / Results  Labs (all labs ordered are listed, but only abnormal results are displayed) Labs Reviewed  HIV ANTIBODY (ROUTINE TESTING W REFLEX)  RPR  CYTOLOGY, (ORAL, ANAL, URETHRAL) ANCILLARY ONLY    EKG   Radiology No results found.  Procedures Procedures (including critical care time)  Medications Ordered in UC Medications - No data to display  Initial Impression / Assessment and Plan / UC Course  I have reviewed the triage vital signs and the nursing notes.  Pertinent labs & imaging results that were available during my care of the patient were reviewed by me and considered in my medical decision making (see chart for details).     Blood is drawn to check HIV and RPR.    Urethral self swab is done and staff will notify him of any positives on that or on the blood work, and treat per protocol.  Flagyl  is sent in for one-time dosing to treat the urethritis and empirically treat trichomonas.  Final Clinical Impressions(s) / UC Diagnoses   Final diagnoses:  Urethritis  Exposure to STD     Discharge Instructions      Staff will notify you if there is anything  positive on the swab or on the blood work.  Take metronidazole  500 mg-- take 4 tablets together by mouth after a good-sized meal.  Avoid drinking alcohol within 72 hours of taking this medication       ED Prescriptions     Medication Sig Dispense Auth. Provider   metroNIDAZOLE  (FLAGYL ) 500 MG tablet Take 4 tablets (2,000 mg total) by mouth once for 1 dose. 4 tablet Nashya Garlington, Paige Boatman, MD      PDMP not reviewed this encounter.   Ann Keto, MD 09/10/23 [phone]

## 2023-09-10 NOTE — ED Triage Notes (Signed)
 Patient presenting with exposure to Trich, no current symptoms.  Prescriptions or OTC medications tried: No

## 2023-09-10 NOTE — Discharge Instructions (Signed)
 Staff will notify you if there is anything positive on the swab or on the blood work.  Take metronidazole  500 mg-- take 4 tablets together by mouth after a good-sized meal.  Avoid drinking alcohol within 72 hours of taking this medication

## 2023-09-11 LAB — CYTOLOGY, (ORAL, ANAL, URETHRAL) ANCILLARY ONLY
Chlamydia: NEGATIVE
Comment: NEGATIVE
Comment: NEGATIVE
Comment: NORMAL
Neisseria Gonorrhea: NEGATIVE
Trichomonas: NEGATIVE

## 2023-09-11 LAB — RPR: RPR Ser Ql: NONREACTIVE
